# Patient Record
Sex: Female | Born: 1985 | Race: Black or African American | Hispanic: No | Marital: Married | Smoking: Never smoker
Health system: Southern US, Community
[De-identification: ages and names within clinical notes are randomized; demographics above are authoritative.]

## PROBLEM LIST (undated history)

## (undated) DIAGNOSIS — D649 Anemia, unspecified: Secondary | ICD-10-CM

## (undated) DIAGNOSIS — D219 Benign neoplasm of connective and other soft tissue, unspecified: Secondary | ICD-10-CM

## (undated) HISTORY — PX: WISDOM TOOTH EXTRACTION: SHX21

## (undated) HISTORY — PX: NO PAST SURGERIES: SHX2092

---

## 2016-09-07 NOTE — L&D Delivery Note (Addendum)
At 4:00 pm I was called for stat delivery as I was en route to hospital. Arrived in the Room At 4:07 pm post a vacuum delivery performed by Dr Gerarda Fraction. Baby being assessed by NICU personnel. Placenta undelivered and repair pending. Assumed care of patient. Dr Cletis Media called and notified of needing assistance with repair. Pt bleeding stable and holding baby.  Repair Note:  Arrived at bedside. Patient calm. Baby skin to skin. Pudendal block with 1% Lidocaine performed as well as local infiltration. Repair of 2 vaginal lacerations with 3-0 Monocryl. Repair of partial 3rd degree laceration with 2-0 Vicryl. Repair of perineum with 3-0 Monocryl. EBL 450 cc Patient tolerated procedure well

## 2016-09-07 NOTE — L&D Delivery Note (Signed)
Delivery Note At 4:04 PM a viable female was delivered via Vaginal, Vacuum (Extractor) (Presentation: LOA ).  APGAR: , ; weight pending.   Placenta status: , .  Cord:  with the following complications: .  Cord pH: 7.2  Anesthesia:  None Episiotomy: None Lacerations:   Suture Repair:  Est. Blood Loss (mL):    Called in the room as standby. Upon arrival patient was complete and pushing. Was noted that baby's heart rate was down. She pushed with good maternal effort but was not  making much progress. Had been bradycardic for about 7 minutes. Decision was made to assist with vacuum extractor. Verbal consent: obtained from patient. The soft vacuum soft cup was positioned over the sagittal suture 3 cm anterior to posterior fontanelle. Pressure was then increased to 500 mmHg, and the patient was instructed to push. Pt was assisted with pulling administered along the pelvic curve. Delivery of fetal head and spontaneous rotation of shoulders and delivery of rest of infant without difficulty. Upon delivery infant had strong cry movement of all extremities.  No nuchal cord present. Baby was taken over to warmer for NICU team to assess.   Private practice midwife arrived and resumed care prior to placenta being delivered.   Luiz Blare, DO OB Fellow

## 2017-05-25 LAB — OB RESULTS CONSOLE GC/CHLAMYDIA
Chlamydia: NEGATIVE
GC PROBE AMP, GENITAL: NEGATIVE

## 2017-05-25 LAB — OB RESULTS CONSOLE GBS: STREP GROUP B AG: POSITIVE

## 2017-06-12 ENCOUNTER — Inpatient Hospital Stay (HOSPITAL_COMMUNITY)
Admission: AD | Admit: 2017-06-12 | Discharge: 2017-06-14 | DRG: 768 | Disposition: A | Discharge status: Home / Self Care | Payer: Managed Care, Other (non HMO) | Source: Ambulatory Visit | Attending: Obstetrics and Gynecology | Admitting: Obstetrics and Gynecology

## 2017-06-12 ENCOUNTER — Encounter (HOSPITAL_COMMUNITY): Payer: Self-pay

## 2017-06-12 DIAGNOSIS — O99824 Streptococcus B carrier state complicating childbirth: Secondary | ICD-10-CM | POA: Diagnosis not present

## 2017-06-12 DIAGNOSIS — D696 Thrombocytopenia, unspecified: Secondary | ICD-10-CM | POA: Diagnosis present

## 2017-06-12 DIAGNOSIS — Z3A39 39 weeks gestation of pregnancy: Secondary | ICD-10-CM

## 2017-06-12 DIAGNOSIS — D649 Anemia, unspecified: Secondary | ICD-10-CM | POA: Diagnosis present

## 2017-06-12 DIAGNOSIS — O9912 Other diseases of the blood and blood-forming organs and certain disorders involving the immune mechanism complicating childbirth: Secondary | ICD-10-CM | POA: Diagnosis present

## 2017-06-12 DIAGNOSIS — O9902 Anemia complicating childbirth: Secondary | ICD-10-CM | POA: Diagnosis present

## 2017-06-12 DIAGNOSIS — O358XX0 Maternal care for other (suspected) fetal abnormality and damage, not applicable or unspecified: Secondary | ICD-10-CM | POA: Diagnosis present

## 2017-06-12 DIAGNOSIS — O26893 Other specified pregnancy related conditions, third trimester: Secondary | ICD-10-CM | POA: Diagnosis present

## 2017-06-12 HISTORY — DX: Anemia, unspecified: D64.9

## 2017-06-12 LAB — CBC
HCT: 41.4 % (ref 36.0–46.0)
Hemoglobin: 13.9 g/dL (ref 12.0–15.0)
MCH: 31.9 pg (ref 26.0–34.0)
MCHC: 33.6 g/dL (ref 30.0–36.0)
MCV: 95 fL (ref 78.0–100.0)
Platelets: 110 10*3/uL — ABNORMAL LOW (ref 150–400)
RBC: 4.36 MIL/uL (ref 3.87–5.11)
RDW: 14.3 % (ref 11.5–15.5)
WBC: 12.8 10*3/uL — ABNORMAL HIGH (ref 4.0–10.5)

## 2017-06-12 LAB — TYPE AND SCREEN
ABO/RH(D): O POS
Antibody Screen: NEGATIVE

## 2017-06-12 LAB — RAPID HIV SCREEN (HIV 1/2 AB+AG)
HIV 1/2 Antibodies: NONREACTIVE
HIV-1 P24 Antigen - HIV24: NONREACTIVE

## 2017-06-12 LAB — ABO/RH: ABO/RH(D): O POS

## 2017-06-12 MED ORDER — DIPHENHYDRAMINE HCL 25 MG PO CAPS: 25.0000 mg | ORAL_CAPSULE | Freq: Four times a day (QID) | ORAL | Status: DC | PRN

## 2017-06-12 MED ORDER — SIMETHICONE 80 MG PO CHEW: 80.0000 mg | CHEWABLE_TABLET | ORAL | Status: DC | PRN

## 2017-06-12 MED ORDER — COCONUT OIL OIL: 1.0000 "application " | TOPICAL_OIL | Status: DC | PRN

## 2017-06-12 MED ORDER — OXYTOCIN BOLUS FROM INFUSION
500.0000 mL | Freq: Once | INTRAVENOUS | Status: AC
Start: 2017-06-12 — End: 2017-06-12
  Administered 2017-06-12: 500 mL via INTRAVENOUS

## 2017-06-12 MED ORDER — LACTATED RINGERS IV SOLN: 500.0000 mL | Freq: Once | INTRAVENOUS | Status: DC

## 2017-06-12 MED ORDER — PHENYLEPHRINE 40 MCG/ML (10ML) SYRINGE FOR IV PUSH (FOR BLOOD PRESSURE SUPPORT)
80.0000 ug | PREFILLED_SYRINGE | INTRAVENOUS | Status: DC | PRN
  Filled 2017-06-12: qty 5

## 2017-06-12 MED ORDER — IBUPROFEN 600 MG PO TABS
600.0000 mg | ORAL_TABLET | Freq: Four times a day (QID) | ORAL | Status: DC
  Administered 2017-06-13 – 2017-06-14 (×4): 600 mg via ORAL
  Filled 2017-06-12 (×6): qty 1

## 2017-06-12 MED ORDER — SOD CITRATE-CITRIC ACID 500-334 MG/5ML PO SOLN: 30.0000 mL | ORAL | Status: DC | PRN

## 2017-06-12 MED ORDER — DIBUCAINE 1 % RE OINT: 1.0000 "application " | TOPICAL_OINTMENT | RECTAL | Status: DC | PRN

## 2017-06-12 MED ORDER — ACETAMINOPHEN 325 MG PO TABS
650.0000 mg | ORAL_TABLET | ORAL | Status: DC | PRN
Start: 2017-06-12 — End: 2017-06-14
  Administered 2017-06-13: 650 mg via ORAL

## 2017-06-12 MED ORDER — OXYCODONE-ACETAMINOPHEN 5-325 MG PO TABS: 1.0000 | ORAL_TABLET | ORAL | Status: DC | PRN

## 2017-06-12 MED ORDER — DIPHENHYDRAMINE HCL 50 MG/ML IJ SOLN: 12.5000 mg | INTRAMUSCULAR | Status: DC | PRN

## 2017-06-12 MED ORDER — BENZOCAINE-MENTHOL 20-0.5 % EX AERO
1.0000 "application " | INHALATION_SPRAY | CUTANEOUS | Status: DC | PRN
  Filled 2017-06-12 (×2): qty 56

## 2017-06-12 MED ORDER — PENICILLIN G POT IN DEXTROSE 60000 UNIT/ML IV SOLN
3.0000 10*6.[IU] | INTRAVENOUS | Status: DC
  Filled 2017-06-12 (×14): qty 50

## 2017-06-12 MED ORDER — EPHEDRINE 5 MG/ML INJ
10.0000 mg | INTRAVENOUS | Status: DC | PRN
  Filled 2017-06-12: qty 2

## 2017-06-12 MED ORDER — TETANUS-DIPHTH-ACELL PERTUSSIS 5-2.5-18.5 LF-MCG/0.5 IM SUSP: 0.5000 mL | Freq: Once | INTRAMUSCULAR | Status: DC

## 2017-06-12 MED ORDER — FENTANYL 2.5 MCG/ML BUPIVACAINE 1/10 % EPIDURAL INFUSION (WH - ANES): 14.0000 mL/h | INTRAMUSCULAR | Status: DC | PRN

## 2017-06-12 MED ORDER — PRENATAL MULTIVITAMIN CH
1.0000 | ORAL_TABLET | Freq: Every day | ORAL | Status: DC
  Administered 2017-06-13 – 2017-06-14 (×2): 1 via ORAL
  Filled 2017-06-12 (×2): qty 1

## 2017-06-12 MED ORDER — LIDOCAINE HCL (PF) 1 % IJ SOLN
30.0000 mL | INTRAMUSCULAR | Status: AC | PRN
  Administered 2017-06-12 (×2): 30 mL via SUBCUTANEOUS
  Filled 2017-06-12 (×2): qty 30

## 2017-06-12 MED ORDER — LACTATED RINGERS IV SOLN
500.0000 mL | INTRAVENOUS | Status: DC | PRN
  Administered 2017-06-12: 1000 mL via INTRAVENOUS

## 2017-06-12 MED ORDER — OXYCODONE-ACETAMINOPHEN 5-325 MG PO TABS: 2.0000 | ORAL_TABLET | ORAL | Status: DC | PRN

## 2017-06-12 MED ORDER — ONDANSETRON HCL 4 MG/2ML IJ SOLN: 4.0000 mg | INTRAMUSCULAR | Status: DC | PRN

## 2017-06-12 MED ORDER — LACTATED RINGERS IV SOLN: INTRAVENOUS | Status: DC

## 2017-06-12 MED ORDER — SENNOSIDES-DOCUSATE SODIUM 8.6-50 MG PO TABS
2.0000 | ORAL_TABLET | ORAL | Status: DC
  Administered 2017-06-12 – 2017-06-14 (×2): 2 via ORAL
  Filled 2017-06-12 (×2): qty 2

## 2017-06-12 MED ORDER — ONDANSETRON HCL 4 MG/2ML IJ SOLN: 4.0000 mg | Freq: Four times a day (QID) | INTRAMUSCULAR | Status: DC | PRN

## 2017-06-12 MED ORDER — ZOLPIDEM TARTRATE 5 MG PO TABS: 5.0000 mg | ORAL_TABLET | Freq: Every evening | ORAL | Status: DC | PRN

## 2017-06-12 MED ORDER — ACETAMINOPHEN 325 MG PO TABS
650.0000 mg | ORAL_TABLET | ORAL | Status: DC | PRN
  Filled 2017-06-12: qty 2

## 2017-06-12 MED ORDER — ONDANSETRON HCL 4 MG PO TABS: 4.0000 mg | ORAL_TABLET | ORAL | Status: DC | PRN

## 2017-06-12 MED ORDER — WITCH HAZEL-GLYCERIN EX PADS: 1.0000 "application " | MEDICATED_PAD | CUTANEOUS | Status: DC | PRN

## 2017-06-12 MED ORDER — OXYTOCIN 40 UNITS IN LACTATED RINGERS INFUSION - SIMPLE MED
2.5000 [IU]/h | INTRAVENOUS | Status: DC
  Filled 2017-06-12: qty 1000

## 2017-06-12 MED ORDER — PENICILLIN G POTASSIUM 5000000 UNITS IJ SOLR
5.0000 10*6.[IU] | Freq: Once | INTRAVENOUS | Status: AC
  Administered 2017-06-12: 5 10*6.[IU] via INTRAVENOUS
  Filled 2017-06-12: qty 5

## 2017-06-12 NOTE — H&P (Signed)
Wanda Coffey is a 31 y.o. female presenting for Spontaneous labor; pregnancy remarkable for absent nasal bone in fetus; anemia, low platelets. OB History    Gravida Para Term Preterm AB Living   1 1 1     1    SAB TAB Ectopic Multiple Live Births         0 1     Past Medical History:  Diagnosis Date  . Anemia    Past Surgical History:  Procedure Laterality Date  . WISDOM TOOTH EXTRACTION     Family History: family history includes Cancer in her father; Heart disease in her father. Social History:  reports that she has never smoked. She has never used smokeless tobacco. She reports that she does not drink alcohol or use drugs.     Maternal Diabetes: No Genetic Screening: Declined Maternal Ultrasounds/Referrals: Declined Fetal Ultrasounds or other Referrals:  Declined Maternal Substance Abuse:  No Significant Maternal Medications:  None Significant Maternal Lab Results:  Lab values include: Group B Strep positive Other Comments:  None  Review of Systems  Gastrointestinal: Positive for abdominal pain.  All other systems reviewed and are negative.  Maternal Medical History:  Reason for admission: Contractions.   Contractions: Onset was 6-12 hours ago.   Frequency: regular.   Perceived severity is strong.    Fetal activity: Perceived fetal activity is normal.   Last perceived fetal movement was within the past hour.    Prenatal complications: Thrombocytopenia.     Dilation: 10 Effacement (%): 100 Station: +2, +3 Exam by:: A. Schwarz RN  Blood pressure 108/70, pulse 81, temperature 98.2 F (36.8 C), temperature source Oral, resp. rate 18, height 5\' 1"  (1.549 m), weight 170 lb (77.1 kg), last menstrual period 09/10/2016, unknown if currently breastfeeding. Maternal Exam:  Uterine Assessment: Contraction strength is moderate.  Contraction frequency is regular.   Abdomen: Patient reports no abdominal tenderness. Estimated fetal weight is 7 pounds.   Fetal presentation:  vertex  Introitus: Normal vulva. Normal vagina.  Amniotic fluid character: meconium stained.  Pelvis: adequate for delivery.   Cervix: Cervix evaluated by digital exam.     Fetal Exam Fetal Monitor Review: Mode: ultrasound.   Variability: moderate (6-25 bpm).   Pattern: accelerations present.    Fetal State Assessment: Category I - tracings are normal.     Physical Exam  Nursing note and vitals reviewed. Constitutional: She is oriented to person, place, and time. She appears well-developed and well-nourished.  HENT:  Head: Normocephalic and atraumatic.  Neck: Normal range of motion.  Cardiovascular: Normal rate and regular rhythm.   Respiratory: Effort normal and breath sounds normal. No respiratory distress.  GI: Soft.  Genitourinary: Vagina normal.  Musculoskeletal: Normal range of motion.  Neurological: She is alert and oriented to person, place, and time.  Skin: Skin is warm and dry.  Psychiatric: She has a normal mood and affect. Her behavior is normal. Judgment and thought content normal.    Prenatal labs: ABO, Rh: --/--/O POS (10/06 1418) Antibody: NEG (10/06 1418) Rubella: Rubella pending RPR:   pending HBsAg:   pending HIV:   pending GBS: Positive (09/18 0000)   Assessment/Plan: Admit IUP @ 39+5 GBS Positive ABX Prophylaxis Expectant management  Lori A Clemmons CNM 06/12/2017, 5:54 PM

## 2017-06-12 NOTE — MAU Note (Signed)
Ctx since 0300 that have been getting stronger and closer together. No bleeding, no LOF. +fetal movement

## 2017-06-13 LAB — CBC
HCT: 34.8 % — ABNORMAL LOW (ref 36.0–46.0)
Hemoglobin: 11.8 g/dL — ABNORMAL LOW (ref 12.0–15.0)
MCH: 31.9 pg (ref 26.0–34.0)
MCHC: 33.9 g/dL (ref 30.0–36.0)
MCV: 94.1 fL (ref 78.0–100.0)
Platelets: 127 10*3/uL — ABNORMAL LOW (ref 150–400)
RBC: 3.7 MIL/uL — ABNORMAL LOW (ref 3.87–5.11)
RDW: 14.2 % (ref 11.5–15.5)
WBC: 22.2 10*3/uL — ABNORMAL HIGH (ref 4.0–10.5)

## 2017-06-13 LAB — RPR: RPR Ser Ql: NONREACTIVE

## 2017-06-13 LAB — HEPATITIS B SURFACE ANTIGEN: Hepatitis B Surface Ag: NEGATIVE

## 2017-06-13 LAB — HIV ANTIBODY (ROUTINE TESTING W REFLEX): HIV Screen 4th Generation wRfx: NONREACTIVE

## 2017-06-13 NOTE — Progress Notes (Signed)
Post Partum Day 1 Subjective: no complaints  Objective: Blood pressure 113/60, pulse 78, temperature 99 F (37.2 C), temperature source Oral, resp. rate 18, height 5\' 1"  (1.549 m), weight 170 lb (77.1 kg), last menstrual period 09/10/2016, unknown if currently breastfeeding.  Physical Exam:  General: alert, cooperative and appears stated age Lochia: appropriate Uterine Fundus: firm Incision:  DVT Evaluation: No evidence of DVT seen on physical exam.   Recent Labs  06/12/17 1418 06/13/17 0533  HGB 13.9 11.8*  HCT 41.4 34.8*    Assessment/Plan: Plan for discharge tomorrow and Breastfeeding   LOS: 1 day   Lori A Clemmons CNM 06/13/2017, 12:24 PM

## 2017-06-14 LAB — RUBELLA SCREEN: Rubella: 11.1 index (ref >0.99)

## 2017-06-14 MED ORDER — IBUPROFEN 600 MG PO TABS: 600.0000 mg | ORAL_TABLET | Freq: Four times a day (QID) | ORAL | 1 refills | Status: DC | PRN

## 2017-06-14 NOTE — Discharge Summary (Signed)
OB Discharge Summary     Patient Name: Wanda Coffey DOB: 04/17/1986 MRN: [id number]  Date of admission: 06/12/2017 Delivering MD: Katheren Shams   Date of discharge: 06/14/2017  Admitting diagnosis: 39.2 WKS, CTXS Intrauterine pregnancy: [redacted]w[redacted]d     Secondary diagnosis:  Active Problems:   Indication for care in labor or delivery   SVD (spontaneous vaginal delivery)  Additional problems: None     Discharge diagnosis: Term Pregnancy Delivered                                                                                                Post partum procedures:None  Augmentation: None  Complications: None  Hospital course:  Onset of Labor With Vaginal Delivery     31 y.o. yo G1P1001 at [redacted]w[redacted]d was admitted in Active Labor in 2nd Stage on 06/12/2017. Patient had an uncomplicated labor course as follows:  Membrane Rupture Time/Date: 3:49 PM ,06/12/2017   Intrapartum Procedures: Episiotomy: None [1]                                         Lacerations:  3rd degree [4]  Patient had a delivery of a Viable infant. 06/12/2017  Information for the patient's newborn:  Bari, Leib [[id number]]       Pateint had an uncomplicated postpartum course.  She is ambulating, tolerating a regular diet, passing flatus, and urinating well. Patient is discharged home in stable condition on 06/14/17.   Physical exam  Vitals:   06/12/17 2330 06/13/17 0525 06/13/17 1900 06/14/17 0517  BP: (!) 105/59 113/60 104/64 (!) 94/58  Pulse: 95 78 85 73  Resp: 18 18 20 20   Temp: 99.4 F (37.4 C) 99 F (37.2 C) 98.9 F (37.2 C) 97.9 F (36.6 C)  TempSrc: Oral Oral Oral Oral  Weight:      Height:       General: alert, cooperative and no distress Lochia: appropriate Uterine Fundus: firm Incision: N/A DVT Evaluation: No significant calf/ankle edema. Labs: Lab Results  Component Value Date   WBC 22.2 (H) 06/13/2017   HGB 11.8 (L) 06/13/2017   HCT 34.8 (L) 06/13/2017   MCV 94.1 06/13/2017   PLT  127 (L) 06/13/2017   No flowsheet data found.  Discharge instruction: per After Visit Summary and "Baby and Me Booklet". Pain Management, Peri-Care, Breastfeeding, Who and When to call for postpartum complications. Information Sheet(s) given PPD & BB, Contraception Choices.   After visit meds:  Allergies as of 06/14/2017   No Known Allergies     Medication List    STOP taking these medications   PROBIOTIC PO     TAKE these medications   ferrous sulfate 325 (65 FE) MG tablet Take 325 mg by mouth 2 (two) times daily.   ibuprofen 600 MG tablet Commonly known as:  ADVIL,MOTRIN Take 1 tablet (600 mg total) by mouth every 6 (six) hours as needed.   prenatal multivitamin Tabs tablet Take 1 tablet by mouth daily at 12 noon.  Diet: routine diet  Activity: Advance as tolerated. Pelvic rest for 6 weeks.   Outpatient follow up:6 weeks Follow up Appt:No future appointments. Follow up Visit:No Follow-up on file.  Postpartum contraception: Undecided  Newborn Data: Live born female-[place] Birth Weight: 8 lb 1.3 oz (3665 g) APGAR: 7, 9  Newborn Delivery   Birth date/time:  06/12/2017 16:04:00 Delivery type:  Vaginal, Vacuum (Extractor)      Baby Feeding: Breast Disposition:home with mother   06/14/2017 Maryann Conners, CNM

## 2017-06-14 NOTE — Lactation Note (Addendum)
This note was copied from a baby's chart. Lactation Consultation Note New mom states BF going well. Mom has been BF for 10 min. Discussed feeding durations, cluster feeding, positions, stimulating to keep baby feeding, snacking verses nutritive feeding. Educated on newborn behavior, STS, I&O, supply and demand. Mom encouraged to feed baby 8-12 times/24 hours and with feeding cues.  Discussed engorgement, filling, mature milk, colostrum, management, breast massage, and breast support. Hand pump given, demonstrated. Mom has large breast, filling, hand expression taught. Easy flow of colostrum noted. Mom has short shaft everted nipples. Shells given to wear, encouraged to wear supportive bra.  Baby has had 8 stools, 4 voids since birth. Discussed BF at home after d/c home.  [place] brochure given w/resources, support groups and [place] services.    Patient Name: Wanda Coffey LTRVU'Y Date: 06/14/2017 Reason for consult: Initial assessment   Maternal Data Has patient been taught Hand Expression?: Yes Does the patient have breastfeeding experience prior to this delivery?: No  Feeding Feeding Type: Breast Fed Length of feed: 25 min  LATCH Score Latch: Repeated attempts needed to sustain latch, nipple held in mouth throughout feeding, stimulation needed to elicit sucking reflex.  Audible Swallowing: Spontaneous and intermittent  Type of Nipple: Everted at rest and after stimulation  Comfort (Breast/Nipple): Soft / non-tender  Hold (Positioning): Assistance needed to correctly position infant at breast and maintain latch.  LATCH Score: 8  Interventions Interventions: Breast feeding basics reviewed;Breast compression;Assisted with latch;Adjust position;Hand pump;Support pillows;Breast massage;Position options;Hand express;Shells  Lactation Tools Discussed/Used Tools: Pump;Shells Shell Type: Inverted Breast pump type: Manual   Consult Status Consult Status: Complete Date:  06/14/17    Theodoro Kalata 06/14/2017, 4:00 AM

## 2017-06-14 NOTE — Discharge Instructions (Signed)
Postpartum Depression and Baby Blues The postpartum period begins right after the birth of a baby. During this time, there is often a great amount of joy and excitement. It is also a time of many changes in the life of the parents. Regardless of how many times a mother gives birth, each child brings new challenges and dynamics to the family. It is not unusual to have feelings of excitement along with confusing shifts in moods, emotions, and thoughts. All mothers are at risk of developing postpartum depression or the "baby blues." These mood changes can occur right after giving birth, or they may occur many months after giving birth. The baby blues or postpartum depression can be mild or severe. Additionally, postpartum depression can go away rather quickly, or it can be a long-term condition. What are the causes? Raised hormone levels and the rapid drop in those levels are thought to be a main cause of postpartum depression and the baby blues. A number of hormones change during and after pregnancy. Estrogen and progesterone usually decrease right after the delivery of your baby. The levels of thyroid hormone and various cortisol steroids also rapidly drop. Other factors that play a role in these mood changes include major life events and genetics. What increases the risk? If you have any of the following risks for the baby blues or postpartum depression, know what symptoms to watch out for during the postpartum period. Risk factors that may increase the likelihood of getting the baby blues or postpartum depression include:  Having a personal or family history of depression.  Having depression while being pregnant.  Having premenstrual mood issues or mood issues related to oral contraceptives.  Having a lot of life stress.  Having marital conflict.  Lacking a social support network.  Having a baby with special needs.  Having health problems, such as diabetes.  What are the signs or  symptoms? Symptoms of baby blues include:  Brief changes in mood, such as going from extreme happiness to sadness.  Decreased concentration.  Difficulty sleeping.  Crying spells, tearfulness.  Irritability.  Anxiety.  Symptoms of postpartum depression typically begin within the first month after giving birth. These symptoms include:  Difficulty sleeping or excessive sleepiness.  Marked weight loss.  Agitation.  Feelings of worthlessness.  Lack of interest in activity or food.  Postpartum psychosis is a very serious condition and can be dangerous. Fortunately, it is rare. Displaying any of the following symptoms is cause for immediate medical attention. Symptoms of postpartum psychosis include:  Hallucinations and delusions.  Bizarre or disorganized behavior.  Confusion or disorientation.  How is this diagnosed? A diagnosis is made by an evaluation of your symptoms. There are no medical or lab tests that lead to a diagnosis, but there are various questionnaires that a health care provider may use to identify those with the baby blues, postpartum depression, or psychosis. Often, a screening tool called the [place] Postnatal Depression Scale is used to diagnose depression in the postpartum period. How is this treated? The baby blues usually goes away on its own in 1-2 weeks. Social support is often all that is needed. You will be encouraged to get adequate sleep and rest. Occasionally, you may be given medicines to help you sleep. Postpartum depression requires treatment because it can last several months or longer if it is not treated. Treatment may include individual or group therapy, medicine, or both to address any social, physiological, and psychological factors that may play a role in the  depression. Regular exercise, a healthy diet, rest, and social support may also be strongly recommended. Postpartum psychosis is more serious and needs treatment right away.  Hospitalization is often needed. Follow these instructions at home:  Get as much rest as you can. Nap when the baby sleeps.  Exercise regularly. Some women find yoga and walking to be beneficial.  Eat a balanced and nourishing diet.  Do little things that you enjoy. Have a cup of tea, take a bubble bath, read your favorite magazine, or listen to your favorite music.  Avoid alcohol.  Ask for help with household chores, cooking, grocery shopping, or running errands as needed. Do not try to do everything.  Talk to people close to you about how you are feeling. Get support from your partner, family members, friends, or other new moms.  Try to stay positive in how you think. Think about the things you are grateful for.  Do not spend a lot of time alone.  Only take over-the-counter or prescription medicine as directed by your health care provider.  Keep all your postpartum appointments.  Let your health care provider know if you have any concerns. Contact a health care provider if: You are having a reaction to or problems with your medicine. Get help right away if:  You have suicidal feelings.  You think you may harm the baby or someone else. This information is not intended to replace advice given to you by your health care provider. Make sure you discuss any questions you have with your health care provider. Document Released: 05/28/2004 Document Revised: 01/30/2016 Document Reviewed: 06/05/2013 Elsevier Interactive Patient Education  2017 [employer]. Contraception Choices Contraception (birth control) is the use of any methods or devices to prevent pregnancy. Below are some methods to help avoid pregnancy. Hormonal methods  Contraceptive implant. This is a thin, plastic tube containing progesterone hormone. It does not contain estrogen hormone. Your health care provider inserts the tube in the inner part of the upper arm. The tube can remain in place for up to 3 years. After 3  years, the implant must be removed. The implant prevents the ovaries from releasing an egg (ovulation), thickens the cervical mucus to prevent sperm from entering the uterus, and thins the lining of the inside of the uterus.  Progesterone-only injections. These injections are given every 3 months by your health care provider to prevent pregnancy. This synthetic progesterone hormone stops the ovaries from releasing eggs. It also thickens cervical mucus and changes the uterine lining. This makes it harder for sperm to survive in the uterus.  Birth control pills. These pills contain estrogen and progesterone hormone. They work by preventing the ovaries from releasing eggs (ovulation). They also cause the cervical mucus to thicken, preventing the sperm from entering the uterus. Birth control pills are prescribed by a health care provider.Birth control pills can also be used to treat heavy periods.  Minipill. This type of birth control pill contains only the progesterone hormone. They are taken every day of each month and must be prescribed by your health care provider.  Birth control patch. The patch contains hormones similar to those in birth control pills. It must be changed once a week and is prescribed by a health care provider.  Vaginal ring. The ring contains hormones similar to those in birth control pills. It is left in the vagina for 3 weeks, removed for 1 week, and then a new one is put back in place. The patient must be comfortable  inserting and removing the ring from the vagina.A health care provider's prescription is necessary.  Emergency contraception. Emergency contraceptives prevent pregnancy after unprotected sexual intercourse. This pill can be taken right after sex or up to 5 days after unprotected sex. It is most effective the sooner you take the pills after having sexual intercourse. Most emergency contraceptive pills are available without a prescription. Check with your pharmacist. Do  not use emergency contraception as your only form of birth control. Barrier methods  Female condom. This is a thin sheath (latex or rubber) that is worn over the penis during sexual intercourse. It can be used with spermicide to increase effectiveness.  Female condom. This is a soft, loose-fitting sheath that is put into the vagina before sexual intercourse.  Diaphragm. This is a soft, latex, dome-shaped barrier that must be fitted by a health care provider. It is inserted into the vagina, along with a spermicidal jelly. It is inserted before intercourse. The diaphragm should be left in the vagina for 6 to 8 hours after intercourse.  Cervical cap. This is a round, soft, latex or plastic cup that fits over the cervix and must be fitted by a health care provider. The cap can be left in place for up to 48 hours after intercourse.  Sponge. This is a soft, circular piece of polyurethane foam. The sponge has spermicide in it. It is inserted into the vagina after wetting it and before sexual intercourse.  Spermicides. These are chemicals that kill or block sperm from entering the cervix and uterus. They come in the form of creams, jellies, suppositories, foam, or tablets. They do not require a prescription. They are inserted into the vagina with an applicator before having sexual intercourse. The process must be repeated every time you have sexual intercourse. Intrauterine contraception  Intrauterine device (IUD). This is a T-shaped device that is put in a woman's uterus during a menstrual period to prevent pregnancy. There are 2 types: ? Copper IUD. This type of IUD is wrapped in copper wire and is placed inside the uterus. Copper makes the uterus and fallopian tubes produce a fluid that kills sperm. It can stay in place for 10 years. ? Hormone IUD. This type of IUD contains the hormone progestin (synthetic progesterone). The hormone thickens the cervical mucus and prevents sperm from entering the uterus,  and it also thins the uterine lining to prevent implantation of a fertilized egg. The hormone can weaken or kill the sperm that get into the uterus. It can stay in place for 3-5 years, depending on which type of IUD is used. Permanent methods of contraception  Female tubal ligation. This is when the woman's fallopian tubes are surgically sealed, tied, or blocked to prevent the egg from traveling to the uterus.  Hysteroscopic sterilization. This involves placing a small coil or insert into each fallopian tube. Your doctor uses a technique called hysteroscopy to do the procedure. The device causes scar tissue to form. This results in permanent blockage of the fallopian tubes, so the sperm cannot fertilize the egg. It takes about 3 months after the procedure for the tubes to become blocked. You must use another form of birth control for these 3 months.  Female sterilization. This is when the female has the tubes that carry sperm tied off (vasectomy).This blocks sperm from entering the vagina during sexual intercourse. After the procedure, the man can still ejaculate fluid (semen). Natural planning methods  Natural family planning. This is not having sexual intercourse or  using a barrier method (condom, diaphragm, cervical cap) on days the woman could become pregnant.  Calendar method. This is keeping track of the length of each menstrual cycle and identifying when you are fertile.  Ovulation method. This is avoiding sexual intercourse during ovulation.  Symptothermal method. This is avoiding sexual intercourse during ovulation, using a thermometer and ovulation symptoms.  Post-ovulation method. This is timing sexual intercourse after you have ovulated. Regardless of which type or method of contraception you choose, it is important that you use condoms to protect against the transmission of sexually transmitted infections (STIs). Talk with your health care provider about which form of contraception is  most appropriate for you. This information is not intended to replace advice given to you by your health care provider. Make sure you discuss any questions you have with your health care provider. Document Released: 08/24/2005 Document Revised: 01/30/2016 Document Reviewed: 02/16/2013 Elsevier Interactive Patient Education  2017 [employer].

## 2018-05-03 ENCOUNTER — Other Ambulatory Visit (HOSPITAL_COMMUNITY): Payer: Self-pay | Admitting: Obstetrics and Gynecology

## 2018-05-03 DIAGNOSIS — O30011 Twin pregnancy, monochorionic/monoamniotic, first trimester: Secondary | ICD-10-CM

## 2018-05-18 ENCOUNTER — Encounter (HOSPITAL_COMMUNITY): Payer: Self-pay

## 2018-05-18 ENCOUNTER — Telehealth (HOSPITAL_COMMUNITY): Payer: Self-pay | Admitting: *Deleted

## 2018-05-20 ENCOUNTER — Ambulatory Visit (HOSPITAL_COMMUNITY)
Admission: RE | Admit: 2018-05-20 | Discharge: 2018-05-20 | Disposition: A | Discharge status: Home / Self Care | Payer: 59 | Source: Ambulatory Visit | Attending: Obstetrics and Gynecology | Admitting: Obstetrics and Gynecology

## 2018-05-20 ENCOUNTER — Other Ambulatory Visit (HOSPITAL_COMMUNITY): Payer: Self-pay | Admitting: *Deleted

## 2018-05-20 ENCOUNTER — Encounter (HOSPITAL_COMMUNITY): Payer: Self-pay

## 2018-05-20 ENCOUNTER — Ambulatory Visit (HOSPITAL_BASED_OUTPATIENT_CLINIC_OR_DEPARTMENT_OTHER)
Admission: RE | Admit: 2018-05-20 | Discharge: 2018-05-20 | Disposition: A | Discharge status: Home / Self Care | Payer: 59 | Source: Ambulatory Visit | Attending: Obstetrics and Gynecology | Admitting: Obstetrics and Gynecology

## 2018-05-20 DIAGNOSIS — D649 Anemia, unspecified: Secondary | ICD-10-CM | POA: Insufficient documentation

## 2018-05-20 DIAGNOSIS — O30032 Twin pregnancy, monochorionic/diamniotic, second trimester: Secondary | ICD-10-CM | POA: Diagnosis not present

## 2018-05-20 DIAGNOSIS — Z3A16 16 weeks gestation of pregnancy: Secondary | ICD-10-CM | POA: Diagnosis not present

## 2018-05-20 DIAGNOSIS — O99012 Anemia complicating pregnancy, second trimester: Secondary | ICD-10-CM

## 2018-05-20 DIAGNOSIS — O30011 Twin pregnancy, monochorionic/monoamniotic, first trimester: Secondary | ICD-10-CM | POA: Diagnosis present

## 2018-05-20 NOTE — Consult Note (Signed)
Maternal-Fetal Medicine  Patient's Name: Wanda Coffey MRN: [id number] Requesting Provider: Carlynn Purl, MD  I had the pleasure of seeing Ms. Dedic today at the [facility]. She was accompanied by her husband. She is a G2 P1 at 27-weeks' gestation with twin pregnancy and is here for ultrasound and consultation. On your office ultrasound, monochorionic-monoamniotic twin pregnancy was suspected.  This is a natural conception. Obstetric history is significant for a term vaginal delivery of a female infant weighing 8-1 at birth.  Gyn history: Patient reports she had regular periods.  PMH: No history of diabetes or hypertension or any other chronic medical conditions. Patient reports she does not have sickle-cell trait. PSH: Nil of note. Medications: Prenatal vitamins. Allergies: NKDA. Social: Denies tobacco or drug or alcohol use. Her partner is an Sales promotion account executive and he is in good health. Family: No history of venous thromboembolism.  A limited ultrasound study was performed. We confirmed monochorionic-diamniotic twin pregnancy. Dividing membrane is clearly seen.  Twin A: Lower fetus, transverse lie, anterior placenta. Amniotic fluid is normal and good fetal activity is seen. Fetal bladder is seen.  Twin B: Upper fetus, transverse, anterior placenta. Amniotic fluid is normal and good fetal activity is seen. Fetal bladder is seen.  No evidence of twin-to-twin transfusion syndrome.  I counseled the couple on the following: Monochorionic-diamniotic twin pregnancy: -Explained chorionicity and its implications. -Monochorionic twins have a higher rate of complications including miscarriages, congenital malformations, twin to twin transfusion syndrome(TTTS) (15%), selective growth restriction, and fetal demise of one or both twins. -Twin pregnancies are associated with increased likelihood of gestational diabetes, gestational hypertension or preeclampsia, malpresentations,  cesarean delivery and postpartum hemorrhage. -Preterm delivery is the most-common complication of twin pregnancies.  -I discussed ultrasound surveillance for TTTS every 2 weeks from 16 weeks until delivery. -I discussed timing and mode of delivery. We recommend delivery at 37 weeks in monochorionic twins to prevent the likelihood of stillbirth of one or both twins that is increased in monochorionic twins. Earlier delivery may be indicated if this pregnancy is complicated by fetal growth restriction or other maternal complications. -In vertex/vertex presentations, vaginal delivery can be safely undertaken. In non-vertex presentation of twin A, we recommend cesarean delivery. If first twin is vertex, and the second twin is non-vertex, either cesarean delivery or vaginal delivery of first twin followed by internal podalic version of second twin may be performed (depends on obstetrician's experience and patient's preference).  Low-dose aspirin is beneficial in preventing preeclampsia. Based on systematic reviews, the [place] Preventive Services Task Force (USPSTF) recommended low-dose aspirin in pregnancies that are at high-risk of developing preeclampsia ([place] [phone]). I recommended low-dose aspirin starting today till delivery.  Recommendations: -Fetal anatomy scan in 2 weeks. -TTTS surveillance ultrasound every 2 weeks. -Fetal growth assessment every 4 weeks. -Weekly antenatal testing from 32 weeks. -Recommend delivery at 37 weeks. -Aspirin 81 mg daily till delivery.  Thank you for your consult. Please do not hesitate to contact me if you have any questions or concerns.  Consultation including face-to-face counseling: 40 min.

## 2018-06-06 ENCOUNTER — Ambulatory Visit (HOSPITAL_COMMUNITY)
Admission: RE | Admit: 2018-06-06 | Discharge: 2018-06-06 | Disposition: A | Discharge status: Home / Self Care | Payer: 59 | Source: Ambulatory Visit | Attending: Obstetrics and Gynecology | Admitting: Obstetrics and Gynecology

## 2018-06-06 ENCOUNTER — Encounter (HOSPITAL_COMMUNITY): Payer: Self-pay

## 2018-06-06 DIAGNOSIS — Z363 Encounter for antenatal screening for malformations: Secondary | ICD-10-CM | POA: Insufficient documentation

## 2018-06-06 DIAGNOSIS — Z3A18 18 weeks gestation of pregnancy: Secondary | ICD-10-CM

## 2018-06-06 DIAGNOSIS — O3412 Maternal care for benign tumor of corpus uteri, second trimester: Secondary | ICD-10-CM | POA: Diagnosis not present

## 2018-06-06 DIAGNOSIS — D259 Leiomyoma of uterus, unspecified: Secondary | ICD-10-CM | POA: Diagnosis not present

## 2018-06-06 DIAGNOSIS — O30011 Twin pregnancy, monochorionic/monoamniotic, first trimester: Secondary | ICD-10-CM

## 2018-06-06 DIAGNOSIS — O30032 Twin pregnancy, monochorionic/diamniotic, second trimester: Secondary | ICD-10-CM | POA: Diagnosis not present

## 2018-06-17 ENCOUNTER — Other Ambulatory Visit (HOSPITAL_COMMUNITY): Payer: Self-pay | Admitting: *Deleted

## 2018-06-17 ENCOUNTER — Other Ambulatory Visit (HOSPITAL_COMMUNITY): Payer: Self-pay | Admitting: Obstetrics and Gynecology

## 2018-06-17 ENCOUNTER — Ambulatory Visit (HOSPITAL_COMMUNITY)
Admission: RE | Admit: 2018-06-17 | Discharge: 2018-06-17 | Disposition: A | Discharge status: Home / Self Care | Payer: 59 | Source: Ambulatory Visit | Attending: Obstetrics and Gynecology | Admitting: Obstetrics and Gynecology

## 2018-06-17 DIAGNOSIS — O30039 Twin pregnancy, monochorionic/diamniotic, unspecified trimester: Secondary | ICD-10-CM

## 2018-06-17 DIAGNOSIS — O30032 Twin pregnancy, monochorionic/diamniotic, second trimester: Secondary | ICD-10-CM

## 2018-06-17 DIAGNOSIS — O3412 Maternal care for benign tumor of corpus uteri, second trimester: Secondary | ICD-10-CM | POA: Diagnosis not present

## 2018-06-17 DIAGNOSIS — D259 Leiomyoma of uterus, unspecified: Secondary | ICD-10-CM | POA: Diagnosis not present

## 2018-06-17 DIAGNOSIS — Z3A20 20 weeks gestation of pregnancy: Secondary | ICD-10-CM | POA: Insufficient documentation

## 2018-06-20 ENCOUNTER — Other Ambulatory Visit (HOSPITAL_COMMUNITY): Payer: Managed Care, Other (non HMO)

## 2018-07-01 ENCOUNTER — Ambulatory Visit (HOSPITAL_COMMUNITY)
Admission: RE | Admit: 2018-07-01 | Discharge: 2018-07-01 | Disposition: A | Discharge status: Home / Self Care | Payer: 59 | Source: Ambulatory Visit | Attending: Obstetrics and Gynecology | Admitting: Obstetrics and Gynecology

## 2018-07-01 ENCOUNTER — Encounter (HOSPITAL_COMMUNITY): Payer: Self-pay

## 2018-07-01 DIAGNOSIS — Z362 Encounter for other antenatal screening follow-up: Secondary | ICD-10-CM | POA: Diagnosis not present

## 2018-07-01 DIAGNOSIS — O30032 Twin pregnancy, monochorionic/diamniotic, second trimester: Secondary | ICD-10-CM | POA: Diagnosis present

## 2018-07-01 DIAGNOSIS — O99012 Anemia complicating pregnancy, second trimester: Secondary | ICD-10-CM | POA: Diagnosis not present

## 2018-07-01 DIAGNOSIS — D259 Leiomyoma of uterus, unspecified: Secondary | ICD-10-CM | POA: Insufficient documentation

## 2018-07-01 DIAGNOSIS — Z3A22 22 weeks gestation of pregnancy: Secondary | ICD-10-CM | POA: Diagnosis not present

## 2018-07-01 DIAGNOSIS — O3412 Maternal care for benign tumor of corpus uteri, second trimester: Secondary | ICD-10-CM | POA: Diagnosis not present

## 2018-07-04 ENCOUNTER — Ambulatory Visit (HOSPITAL_COMMUNITY): Payer: Managed Care, Other (non HMO)

## 2018-07-15 ENCOUNTER — Other Ambulatory Visit (HOSPITAL_COMMUNITY): Payer: Self-pay | Admitting: *Deleted

## 2018-07-15 ENCOUNTER — Encounter (HOSPITAL_COMMUNITY): Payer: Self-pay

## 2018-07-15 ENCOUNTER — Ambulatory Visit (HOSPITAL_COMMUNITY)
Admission: RE | Admit: 2018-07-15 | Discharge: 2018-07-15 | Disposition: A | Discharge status: Home / Self Care | Payer: 59 | Source: Ambulatory Visit | Attending: Obstetrics and Gynecology | Admitting: Obstetrics and Gynecology

## 2018-07-15 DIAGNOSIS — O30032 Twin pregnancy, monochorionic/diamniotic, second trimester: Secondary | ICD-10-CM | POA: Insufficient documentation

## 2018-07-15 DIAGNOSIS — O3412 Maternal care for benign tumor of corpus uteri, second trimester: Secondary | ICD-10-CM

## 2018-07-15 DIAGNOSIS — D259 Leiomyoma of uterus, unspecified: Secondary | ICD-10-CM

## 2018-07-15 DIAGNOSIS — O30039 Twin pregnancy, monochorionic/diamniotic, unspecified trimester: Secondary | ICD-10-CM

## 2018-07-15 DIAGNOSIS — Z362 Encounter for other antenatal screening follow-up: Secondary | ICD-10-CM

## 2018-07-15 DIAGNOSIS — Z3A24 24 weeks gestation of pregnancy: Secondary | ICD-10-CM

## 2018-07-15 DIAGNOSIS — O99012 Anemia complicating pregnancy, second trimester: Secondary | ICD-10-CM

## 2018-07-29 ENCOUNTER — Other Ambulatory Visit (HOSPITAL_COMMUNITY): Payer: Self-pay | Admitting: Maternal and Fetal Medicine

## 2018-07-29 ENCOUNTER — Ambulatory Visit (HOSPITAL_COMMUNITY)
Admission: RE | Admit: 2018-07-29 | Discharge: 2018-07-29 | Disposition: A | Discharge status: Home / Self Care | Payer: 59 | Source: Ambulatory Visit | Attending: Maternal & Fetal Medicine | Admitting: Maternal & Fetal Medicine

## 2018-07-29 ENCOUNTER — Other Ambulatory Visit (HOSPITAL_COMMUNITY): Payer: Self-pay | Admitting: *Deleted

## 2018-07-29 ENCOUNTER — Encounter (HOSPITAL_COMMUNITY): Payer: Self-pay

## 2018-07-29 DIAGNOSIS — O3412 Maternal care for benign tumor of corpus uteri, second trimester: Secondary | ICD-10-CM | POA: Diagnosis not present

## 2018-07-29 DIAGNOSIS — Z3A26 26 weeks gestation of pregnancy: Secondary | ICD-10-CM | POA: Diagnosis not present

## 2018-07-29 DIAGNOSIS — O30032 Twin pregnancy, monochorionic/diamniotic, second trimester: Secondary | ICD-10-CM | POA: Diagnosis not present

## 2018-07-29 DIAGNOSIS — O99012 Anemia complicating pregnancy, second trimester: Secondary | ICD-10-CM | POA: Diagnosis not present

## 2018-07-29 DIAGNOSIS — O30039 Twin pregnancy, monochorionic/diamniotic, unspecified trimester: Secondary | ICD-10-CM

## 2018-08-09 LAB — OB RESULTS CONSOLE ABO/RH: RH Type: POSITIVE

## 2018-08-09 LAB — OB RESULTS CONSOLE HEPATITIS B SURFACE ANTIGEN: Hepatitis B Surface Ag: NEGATIVE

## 2018-08-09 LAB — OB RESULTS CONSOLE ANTIBODY SCREEN: Antibody Screen: NEGATIVE

## 2018-08-09 LAB — OB RESULTS CONSOLE RUBELLA ANTIBODY, IGM: Rubella: IMMUNE

## 2018-08-09 LAB — OB RESULTS CONSOLE HIV ANTIBODY (ROUTINE TESTING): HIV: NONREACTIVE

## 2018-08-09 LAB — OB RESULTS CONSOLE GC/CHLAMYDIA
CHLAMYDIA, DNA PROBE: NEGATIVE
Gonorrhea: NEGATIVE

## 2018-08-09 LAB — OB RESULTS CONSOLE RPR: RPR: NONREACTIVE

## 2018-08-12 ENCOUNTER — Ambulatory Visit (HOSPITAL_COMMUNITY)
Admission: RE | Admit: 2018-08-12 | Discharge: 2018-08-12 | Disposition: A | Discharge status: Home / Self Care | Payer: 59 | Source: Ambulatory Visit | Attending: Obstetrics and Gynecology | Admitting: Obstetrics and Gynecology

## 2018-08-12 ENCOUNTER — Other Ambulatory Visit (HOSPITAL_COMMUNITY): Payer: Self-pay | Admitting: *Deleted

## 2018-08-12 ENCOUNTER — Encounter (HOSPITAL_COMMUNITY): Payer: Self-pay

## 2018-08-12 DIAGNOSIS — O321XX1 Maternal care for breech presentation, fetus 1: Secondary | ICD-10-CM | POA: Insufficient documentation

## 2018-08-12 DIAGNOSIS — O3413 Maternal care for benign tumor of corpus uteri, third trimester: Secondary | ICD-10-CM | POA: Diagnosis not present

## 2018-08-12 DIAGNOSIS — O30039 Twin pregnancy, monochorionic/diamniotic, unspecified trimester: Secondary | ICD-10-CM

## 2018-08-12 DIAGNOSIS — D259 Leiomyoma of uterus, unspecified: Secondary | ICD-10-CM | POA: Insufficient documentation

## 2018-08-12 DIAGNOSIS — O99013 Anemia complicating pregnancy, third trimester: Secondary | ICD-10-CM | POA: Insufficient documentation

## 2018-08-12 DIAGNOSIS — D649 Anemia, unspecified: Secondary | ICD-10-CM | POA: Insufficient documentation

## 2018-08-12 DIAGNOSIS — O30033 Twin pregnancy, monochorionic/diamniotic, third trimester: Secondary | ICD-10-CM | POA: Diagnosis not present

## 2018-08-12 DIAGNOSIS — O3412 Maternal care for benign tumor of corpus uteri, second trimester: Secondary | ICD-10-CM

## 2018-08-12 DIAGNOSIS — Z3A28 28 weeks gestation of pregnancy: Secondary | ICD-10-CM | POA: Diagnosis not present

## 2018-08-15 ENCOUNTER — Other Ambulatory Visit (HOSPITAL_COMMUNITY): Payer: Self-pay

## 2018-08-26 ENCOUNTER — Ambulatory Visit (HOSPITAL_COMMUNITY)
Admission: RE | Admit: 2018-08-26 | Discharge: 2018-08-26 | Disposition: A | Discharge status: Home / Self Care | Payer: 59 | Source: Ambulatory Visit | Attending: Obstetrics and Gynecology | Admitting: Obstetrics and Gynecology

## 2018-08-26 ENCOUNTER — Encounter (HOSPITAL_COMMUNITY): Payer: Self-pay

## 2018-08-26 DIAGNOSIS — O3412 Maternal care for benign tumor of corpus uteri, second trimester: Secondary | ICD-10-CM | POA: Diagnosis not present

## 2018-08-26 DIAGNOSIS — O30039 Twin pregnancy, monochorionic/diamniotic, unspecified trimester: Secondary | ICD-10-CM | POA: Diagnosis present

## 2018-08-26 DIAGNOSIS — Z3A30 30 weeks gestation of pregnancy: Secondary | ICD-10-CM | POA: Insufficient documentation

## 2018-08-26 DIAGNOSIS — Z362 Encounter for other antenatal screening follow-up: Secondary | ICD-10-CM | POA: Diagnosis not present

## 2018-08-26 DIAGNOSIS — O99013 Anemia complicating pregnancy, third trimester: Secondary | ICD-10-CM

## 2018-08-26 DIAGNOSIS — O30033 Twin pregnancy, monochorionic/diamniotic, third trimester: Secondary | ICD-10-CM | POA: Diagnosis not present

## 2018-09-09 ENCOUNTER — Ambulatory Visit (HOSPITAL_COMMUNITY)
Admission: RE | Admit: 2018-09-09 | Discharge: 2018-09-09 | Disposition: A | Discharge status: Home / Self Care | Payer: PRIVATE HEALTH INSURANCE | Source: Ambulatory Visit | Attending: Obstetrics and Gynecology | Admitting: Obstetrics and Gynecology

## 2018-09-09 ENCOUNTER — Encounter (HOSPITAL_COMMUNITY): Payer: Self-pay

## 2018-09-09 ENCOUNTER — Other Ambulatory Visit (HOSPITAL_COMMUNITY): Payer: Self-pay | Admitting: Maternal & Fetal Medicine

## 2018-09-09 ENCOUNTER — Other Ambulatory Visit (HOSPITAL_COMMUNITY): Payer: Self-pay | Admitting: *Deleted

## 2018-09-09 DIAGNOSIS — Z3A32 32 weeks gestation of pregnancy: Secondary | ICD-10-CM

## 2018-09-09 DIAGNOSIS — O99013 Anemia complicating pregnancy, third trimester: Secondary | ICD-10-CM | POA: Diagnosis not present

## 2018-09-09 DIAGNOSIS — O30039 Twin pregnancy, monochorionic/diamniotic, unspecified trimester: Secondary | ICD-10-CM

## 2018-09-09 DIAGNOSIS — O3413 Maternal care for benign tumor of corpus uteri, third trimester: Secondary | ICD-10-CM | POA: Diagnosis not present

## 2018-09-09 DIAGNOSIS — O30033 Twin pregnancy, monochorionic/diamniotic, third trimester: Secondary | ICD-10-CM | POA: Diagnosis not present

## 2018-09-14 ENCOUNTER — Other Ambulatory Visit (HOSPITAL_COMMUNITY): Payer: Self-pay | Admitting: *Deleted

## 2018-09-14 DIAGNOSIS — O30039 Twin pregnancy, monochorionic/diamniotic, unspecified trimester: Secondary | ICD-10-CM

## 2018-09-16 ENCOUNTER — Encounter (HOSPITAL_COMMUNITY): Payer: Self-pay

## 2018-09-16 ENCOUNTER — Ambulatory Visit (HOSPITAL_COMMUNITY)
Admission: RE | Admit: 2018-09-16 | Discharge: 2018-09-16 | Disposition: A | Discharge status: Home / Self Care | Payer: 59 | Source: Ambulatory Visit | Attending: Obstetrics and Gynecology | Admitting: Obstetrics and Gynecology

## 2018-09-16 DIAGNOSIS — O30033 Twin pregnancy, monochorionic/diamniotic, third trimester: Secondary | ICD-10-CM

## 2018-09-16 DIAGNOSIS — O30039 Twin pregnancy, monochorionic/diamniotic, unspecified trimester: Secondary | ICD-10-CM | POA: Insufficient documentation

## 2018-09-16 DIAGNOSIS — Z3A33 33 weeks gestation of pregnancy: Secondary | ICD-10-CM | POA: Diagnosis not present

## 2018-09-16 DIAGNOSIS — O34211 Maternal care for low transverse scar from previous cesarean delivery: Secondary | ICD-10-CM | POA: Diagnosis not present

## 2018-09-16 DIAGNOSIS — O99013 Anemia complicating pregnancy, third trimester: Secondary | ICD-10-CM | POA: Diagnosis not present

## 2018-09-20 LAB — OB RESULTS CONSOLE GBS: GBS: POSITIVE

## 2018-09-21 ENCOUNTER — Other Ambulatory Visit (HOSPITAL_COMMUNITY): Payer: Self-pay | Admitting: *Deleted

## 2018-09-21 DIAGNOSIS — O30033 Twin pregnancy, monochorionic/diamniotic, third trimester: Secondary | ICD-10-CM

## 2018-09-22 ENCOUNTER — Other Ambulatory Visit (HOSPITAL_COMMUNITY): Payer: Self-pay | Admitting: *Deleted

## 2018-09-22 DIAGNOSIS — O30033 Twin pregnancy, monochorionic/diamniotic, third trimester: Secondary | ICD-10-CM

## 2018-09-23 ENCOUNTER — Encounter (HOSPITAL_COMMUNITY): Payer: Self-pay

## 2018-09-23 ENCOUNTER — Ambulatory Visit (HOSPITAL_COMMUNITY)
Admission: RE | Admit: 2018-09-23 | Discharge: 2018-09-23 | Disposition: A | Discharge status: Home / Self Care | Payer: 59 | Source: Ambulatory Visit | Attending: Obstetrics and Gynecology | Admitting: Obstetrics and Gynecology

## 2018-09-23 DIAGNOSIS — O99013 Anemia complicating pregnancy, third trimester: Secondary | ICD-10-CM

## 2018-09-23 DIAGNOSIS — Z3A34 34 weeks gestation of pregnancy: Secondary | ICD-10-CM

## 2018-09-23 DIAGNOSIS — O3413 Maternal care for benign tumor of corpus uteri, third trimester: Secondary | ICD-10-CM

## 2018-09-23 DIAGNOSIS — O30033 Twin pregnancy, monochorionic/diamniotic, third trimester: Secondary | ICD-10-CM | POA: Diagnosis not present

## 2018-09-23 DIAGNOSIS — O30039 Twin pregnancy, monochorionic/diamniotic, unspecified trimester: Secondary | ICD-10-CM | POA: Diagnosis not present

## 2018-09-30 ENCOUNTER — Encounter (HOSPITAL_COMMUNITY): Payer: Self-pay

## 2018-09-30 ENCOUNTER — Ambulatory Visit (HOSPITAL_COMMUNITY)
Admission: RE | Admit: 2018-09-30 | Discharge: 2018-09-30 | Disposition: A | Discharge status: Home / Self Care | Payer: 59 | Source: Ambulatory Visit | Attending: Obstetrics and Gynecology | Admitting: Obstetrics and Gynecology

## 2018-09-30 DIAGNOSIS — O30039 Twin pregnancy, monochorionic/diamniotic, unspecified trimester: Secondary | ICD-10-CM | POA: Insufficient documentation

## 2018-09-30 DIAGNOSIS — O99013 Anemia complicating pregnancy, third trimester: Secondary | ICD-10-CM

## 2018-09-30 DIAGNOSIS — O3411 Maternal care for benign tumor of corpus uteri, first trimester: Secondary | ICD-10-CM | POA: Diagnosis not present

## 2018-09-30 DIAGNOSIS — O30033 Twin pregnancy, monochorionic/diamniotic, third trimester: Secondary | ICD-10-CM

## 2018-09-30 DIAGNOSIS — Z3A35 35 weeks gestation of pregnancy: Secondary | ICD-10-CM

## 2018-10-06 ENCOUNTER — Encounter (HOSPITAL_COMMUNITY): Payer: Self-pay

## 2018-10-07 ENCOUNTER — Encounter (HOSPITAL_COMMUNITY): Payer: Self-pay

## 2018-10-07 ENCOUNTER — Ambulatory Visit (HOSPITAL_BASED_OUTPATIENT_CLINIC_OR_DEPARTMENT_OTHER)
Admission: RE | Admit: 2018-10-07 | Discharge: 2018-10-07 | Disposition: A | Discharge status: Home / Self Care | Payer: 59 | Source: Ambulatory Visit | Attending: Obstetrics and Gynecology | Admitting: Obstetrics and Gynecology

## 2018-10-07 DIAGNOSIS — D259 Leiomyoma of uterus, unspecified: Secondary | ICD-10-CM

## 2018-10-07 DIAGNOSIS — Z3A37 37 weeks gestation of pregnancy: Secondary | ICD-10-CM

## 2018-10-07 DIAGNOSIS — O30033 Twin pregnancy, monochorionic/diamniotic, third trimester: Secondary | ICD-10-CM

## 2018-10-07 DIAGNOSIS — O3411 Maternal care for benign tumor of corpus uteri, first trimester: Secondary | ICD-10-CM | POA: Diagnosis not present

## 2018-10-07 DIAGNOSIS — O99013 Anemia complicating pregnancy, third trimester: Secondary | ICD-10-CM

## 2018-10-07 DIAGNOSIS — O30039 Twin pregnancy, monochorionic/diamniotic, unspecified trimester: Secondary | ICD-10-CM

## 2018-10-08 ENCOUNTER — Inpatient Hospital Stay (HOSPITAL_COMMUNITY)
Admission: AD | Admit: 2018-10-08 | Discharge: 2018-10-11 | DRG: 787 | Disposition: A | Discharge status: Home / Self Care | Payer: 59 | Attending: Obstetrics and Gynecology | Admitting: Obstetrics and Gynecology

## 2018-10-08 ENCOUNTER — Other Ambulatory Visit: Payer: Self-pay

## 2018-10-08 ENCOUNTER — Encounter (HOSPITAL_COMMUNITY)
Admission: AD | Disposition: A | Discharge status: Home / Self Care | Payer: Self-pay | Source: Home / Self Care | Attending: Obstetrics and Gynecology

## 2018-10-08 ENCOUNTER — Encounter (HOSPITAL_COMMUNITY): Payer: Self-pay

## 2018-10-08 ENCOUNTER — Inpatient Hospital Stay (HOSPITAL_COMMUNITY): Payer: 59 | Admitting: Anesthesiology

## 2018-10-08 DIAGNOSIS — O321XX2 Maternal care for breech presentation, fetus 2: Secondary | ICD-10-CM | POA: Diagnosis present

## 2018-10-08 DIAGNOSIS — O322XX2 Maternal care for transverse and oblique lie, fetus 2: Secondary | ICD-10-CM

## 2018-10-08 DIAGNOSIS — O322XX1 Maternal care for transverse and oblique lie, fetus 1: Secondary | ICD-10-CM | POA: Diagnosis present

## 2018-10-08 DIAGNOSIS — O321XX1 Maternal care for breech presentation, fetus 1: Secondary | ICD-10-CM | POA: Diagnosis present

## 2018-10-08 DIAGNOSIS — O30033 Twin pregnancy, monochorionic/diamniotic, third trimester: Secondary | ICD-10-CM

## 2018-10-08 DIAGNOSIS — O3413 Maternal care for benign tumor of corpus uteri, third trimester: Secondary | ICD-10-CM | POA: Diagnosis present

## 2018-10-08 DIAGNOSIS — D259 Leiomyoma of uterus, unspecified: Secondary | ICD-10-CM | POA: Diagnosis present

## 2018-10-08 DIAGNOSIS — O9902 Anemia complicating childbirth: Secondary | ICD-10-CM | POA: Diagnosis present

## 2018-10-08 DIAGNOSIS — D649 Anemia, unspecified: Secondary | ICD-10-CM | POA: Diagnosis present

## 2018-10-08 DIAGNOSIS — O99824 Streptococcus B carrier state complicating childbirth: Secondary | ICD-10-CM | POA: Diagnosis present

## 2018-10-08 DIAGNOSIS — Z98891 History of uterine scar from previous surgery: Secondary | ICD-10-CM

## 2018-10-08 DIAGNOSIS — O42013 Preterm premature rupture of membranes, onset of labor within 24 hours of rupture, third trimester: Secondary | ICD-10-CM

## 2018-10-08 DIAGNOSIS — Z3A36 36 weeks gestation of pregnancy: Secondary | ICD-10-CM | POA: Diagnosis not present

## 2018-10-08 LAB — CBC
HCT: 36.2 % (ref 36.0–46.0)
HCT: 31.2 % — ABNORMAL LOW (ref 36.0–46.0)
HEMATOCRIT: 29.6 % — AB (ref 36.0–46.0)
Hemoglobin: 10.9 g/dL — ABNORMAL LOW (ref 12.0–15.0)
Hemoglobin: 9.2 g/dL — ABNORMAL LOW (ref 12.0–15.0)
Hemoglobin: 9 g/dL — ABNORMAL LOW (ref 12.0–15.0)
MCH: 23.7 pg — ABNORMAL LOW (ref 26.0–34.0)
MCH: 23.6 pg — ABNORMAL LOW (ref 26.0–34.0)
MCH: 23.9 pg — ABNORMAL LOW (ref 26.0–34.0)
MCHC: 30.1 g/dL (ref 30.0–36.0)
MCHC: 29.5 g/dL — ABNORMAL LOW (ref 30.0–36.0)
MCHC: 30.4 g/dL (ref 30.0–36.0)
MCV: 78.9 fL — ABNORMAL LOW (ref 80.0–100.0)
MCV: 80 fL (ref 80.0–100.0)
MCV: 78.5 fL — ABNORMAL LOW (ref 80.0–100.0)
Platelets: 114 10*3/uL — ABNORMAL LOW (ref 150–400)
Platelets: 103 10*3/uL — ABNORMAL LOW (ref 150–400)
Platelets: 102 10*3/uL — ABNORMAL LOW (ref 150–400)
RBC: 4.59 MIL/uL (ref 3.87–5.11)
RBC: 3.9 MIL/uL (ref 3.87–5.11)
RBC: 3.77 MIL/uL — ABNORMAL LOW (ref 3.87–5.11)
RDW: 20.4 % — ABNORMAL HIGH (ref 11.5–15.5)
RDW: 20.4 % — AB (ref 11.5–15.5)
RDW: 20.2 % — ABNORMAL HIGH (ref 11.5–15.5)
WBC: 8.2 10*3/uL (ref 4.0–10.5)
WBC: 12.9 10*3/uL — ABNORMAL HIGH (ref 4.0–10.5)
WBC: 16 10*3/uL — ABNORMAL HIGH (ref 4.0–10.5)
nRBC: 0 % (ref 0.0–0.2)
nRBC: 0 % (ref 0.0–0.2)
nRBC: 0.5 % — ABNORMAL HIGH (ref 0.0–0.2)

## 2018-10-08 LAB — DIC (DISSEMINATED INTRAVASCULAR COAGULATION)PANEL
Fibrinogen: 290 mg/dL (ref 210–475)
INR: 1.1
Platelets: 110 10*3/uL — ABNORMAL LOW (ref 150–400)
Prothrombin Time: 14.1 seconds (ref 11.4–15.2)
Smear Review: NONE SEEN
aPTT: 30 seconds (ref 24–36)

## 2018-10-08 LAB — POCT FERN TEST: POCT Fern Test: POSITIVE

## 2018-10-08 LAB — DIC (DISSEMINATED INTRAVASCULAR COAGULATION) PANEL: D DIMER QUANT: 7.42 ug{FEU}/mL — AB (ref 0.00–0.50)

## 2018-10-08 LAB — PREPARE RBC (CROSSMATCH)

## 2018-10-08 LAB — RPR: RPR Ser Ql: NONREACTIVE

## 2018-10-08 SURGERY — Surgical Case
Anesthesia: Spinal | Site: Abdomen | Wound class: Clean Contaminated

## 2018-10-08 MED ORDER — ZOLPIDEM TARTRATE 5 MG PO TABS: 5.0000 mg | ORAL_TABLET | Freq: Every evening | ORAL | Status: DC | PRN

## 2018-10-08 MED ORDER — NALOXONE HCL 0.4 MG/ML IJ SOLN: 0.4000 mg | INTRAMUSCULAR | Status: DC | PRN

## 2018-10-08 MED ORDER — ONDANSETRON HCL 4 MG/2ML IJ SOLN
INTRAMUSCULAR | Status: AC
  Filled 2018-10-08: qty 2

## 2018-10-08 MED ORDER — ONDANSETRON HCL 4 MG/2ML IJ SOLN: 4.0000 mg | Freq: Once | INTRAMUSCULAR | Status: DC | PRN

## 2018-10-08 MED ORDER — BETAMETHASONE SOD PHOS & ACET 6 (3-3) MG/ML IJ SUSP
12.0000 mg | Freq: Once | INTRAMUSCULAR | Status: AC
  Administered 2018-10-08: 12 mg via INTRAMUSCULAR
  Filled 2018-10-08: qty 2

## 2018-10-08 MED ORDER — SIMETHICONE 80 MG PO CHEW
80.0000 mg | CHEWABLE_TABLET | Freq: Three times a day (TID) | ORAL | Status: DC
  Administered 2018-10-08 – 2018-10-11 (×7): 80 mg via ORAL
  Filled 2018-10-08 (×7): qty 1

## 2018-10-08 MED ORDER — OXYTOCIN 40 UNITS IN NORMAL SALINE INFUSION - SIMPLE MED: 2.5000 [IU]/h | INTRAVENOUS | Status: AC

## 2018-10-08 MED ORDER — ONDANSETRON HCL 4 MG/2ML IJ SOLN
INTRAMUSCULAR | Status: DC | PRN
  Administered 2018-10-08: 4 mg via INTRAVENOUS

## 2018-10-08 MED ORDER — KETOROLAC TROMETHAMINE 30 MG/ML IJ SOLN: 30.0000 mg | Freq: Four times a day (QID) | INTRAMUSCULAR | Status: AC | PRN

## 2018-10-08 MED ORDER — HYDROMORPHONE HCL 1 MG/ML IJ SOLN
1.0000 mg | Freq: Once | INTRAMUSCULAR | Status: AC
  Administered 2018-10-08: 1 mg via INTRAVENOUS

## 2018-10-08 MED ORDER — HYDROMORPHONE HCL 1 MG/ML IJ SOLN
INTRAMUSCULAR | Status: AC
  Filled 2018-10-08: qty 1

## 2018-10-08 MED ORDER — NALBUPHINE HCL 10 MG/ML IJ SOLN: 5.0000 mg | Freq: Once | INTRAMUSCULAR | Status: DC | PRN

## 2018-10-08 MED ORDER — SCOPOLAMINE 1 MG/3DAYS TD PT72
1.0000 | MEDICATED_PATCH | Freq: Once | TRANSDERMAL | Status: DC
  Filled 2018-10-08: qty 1

## 2018-10-08 MED ORDER — PHENYLEPHRINE 8 MG IN D5W 100 ML (0.08MG/ML) PREMIX OPTIME
INJECTION | INTRAVENOUS | Status: AC
  Filled 2018-10-08: qty 100

## 2018-10-08 MED ORDER — HYDROCODONE-ACETAMINOPHEN 7.5-325 MG PO TABS: 1.0000 | ORAL_TABLET | Freq: Once | ORAL | Status: DC | PRN

## 2018-10-08 MED ORDER — LACTATED RINGERS IV SOLN
INTRAVENOUS | Status: DC
  Administered 2018-10-08 (×3): via INTRAVENOUS

## 2018-10-08 MED ORDER — BUPIVACAINE IN DEXTROSE 0.75-8.25 % IT SOLN
INTRATHECAL | Status: DC | PRN
  Administered 2018-10-08: 1.6 mL via INTRATHECAL

## 2018-10-08 MED ORDER — ACETAMINOPHEN 10 MG/ML IV SOLN
1000.0000 mg | Freq: Once | INTRAVENOUS | Status: DC | PRN
  Administered 2018-10-08: 1000 mg via INTRAVENOUS

## 2018-10-08 MED ORDER — TRANEXAMIC ACID-NACL 1000-0.7 MG/100ML-% IV SOLN
1000.0000 mg | INTRAVENOUS | Status: AC
  Administered 2018-10-08: 1000 mg via INTRAVENOUS

## 2018-10-08 MED ORDER — SENNOSIDES-DOCUSATE SODIUM 8.6-50 MG PO TABS
2.0000 | ORAL_TABLET | ORAL | Status: DC
  Administered 2018-10-08 – 2018-10-10 (×3): 2 via ORAL
  Filled 2018-10-08 (×3): qty 2

## 2018-10-08 MED ORDER — IBUPROFEN 800 MG PO TABS
800.0000 mg | ORAL_TABLET | Freq: Three times a day (TID) | ORAL | Status: DC
  Administered 2018-10-08 – 2018-10-11 (×8): 800 mg via ORAL
  Filled 2018-10-08 (×8): qty 1

## 2018-10-08 MED ORDER — DIPHENHYDRAMINE HCL 25 MG PO CAPS
25.0000 mg | ORAL_CAPSULE | ORAL | Status: DC | PRN
  Filled 2018-10-08: qty 1

## 2018-10-08 MED ORDER — DIPHENHYDRAMINE HCL 25 MG PO CAPS
25.0000 mg | ORAL_CAPSULE | Freq: Four times a day (QID) | ORAL | Status: DC | PRN
  Administered 2018-10-08 – 2018-10-09 (×2): 25 mg via ORAL
  Filled 2018-10-08 (×2): qty 1

## 2018-10-08 MED ORDER — MEPERIDINE HCL 25 MG/ML IJ SOLN: 6.2500 mg | INTRAMUSCULAR | Status: DC | PRN

## 2018-10-08 MED ORDER — SIMETHICONE 80 MG PO CHEW
80.0000 mg | CHEWABLE_TABLET | ORAL | Status: DC
  Administered 2018-10-08 – 2018-10-10 (×3): 80 mg via ORAL
  Filled 2018-10-08 (×3): qty 1

## 2018-10-08 MED ORDER — FERROUS SULFATE 325 (65 FE) MG PO TABS
325.0000 mg | ORAL_TABLET | Freq: Two times a day (BID) | ORAL | Status: DC
  Administered 2018-10-08 – 2018-10-11 (×6): 325 mg via ORAL
  Filled 2018-10-08 (×6): qty 1

## 2018-10-08 MED ORDER — COCONUT OIL OIL: 1.0000 "application " | TOPICAL_OIL | Status: DC | PRN

## 2018-10-08 MED ORDER — SOD CITRATE-CITRIC ACID 500-334 MG/5ML PO SOLN
30.0000 mL | Freq: Once | ORAL | Status: AC
  Administered 2018-10-08: 30 mL via ORAL
  Filled 2018-10-08: qty 15

## 2018-10-08 MED ORDER — NALOXONE HCL 4 MG/10ML IJ SOLN
1.0000 ug/kg/h | INTRAVENOUS | Status: DC | PRN
  Filled 2018-10-08: qty 5

## 2018-10-08 MED ORDER — DIPHENHYDRAMINE HCL 50 MG/ML IJ SOLN
12.5000 mg | INTRAMUSCULAR | Status: DC | PRN
  Administered 2018-10-08: 12.5 mg via INTRAVENOUS

## 2018-10-08 MED ORDER — CEFAZOLIN SODIUM-DEXTROSE 2-4 GM/100ML-% IV SOLN
2.0000 g | Freq: Three times a day (TID) | INTRAVENOUS | Status: AC
  Administered 2018-10-08 (×2): 2 g via INTRAVENOUS
  Filled 2018-10-08 (×2): qty 100

## 2018-10-08 MED ORDER — LACTATED RINGERS IV SOLN
INTRAVENOUS | Status: DC
  Administered 2018-10-08 – 2018-10-09 (×3): via INTRAVENOUS

## 2018-10-08 MED ORDER — OXYTOCIN 10 UNIT/ML IJ SOLN
INTRAVENOUS | Status: DC | PRN
  Administered 2018-10-08: 40 [IU] via INTRAVENOUS

## 2018-10-08 MED ORDER — ALBUMIN HUMAN 5 % IV SOLN
INTRAVENOUS | Status: AC
  Filled 2018-10-08: qty 250

## 2018-10-08 MED ORDER — MORPHINE SULFATE (PF) 0.5 MG/ML IJ SOLN
INTRAMUSCULAR | Status: AC
  Filled 2018-10-08: qty 10

## 2018-10-08 MED ORDER — OXYTOCIN 10 UNIT/ML IJ SOLN
INTRAMUSCULAR | Status: AC
  Filled 2018-10-08: qty 4

## 2018-10-08 MED ORDER — METHYLERGONOVINE MALEATE 0.2 MG/ML IJ SOLN
INTRAMUSCULAR | Status: AC
  Filled 2018-10-08: qty 1

## 2018-10-08 MED ORDER — LACTATED RINGERS IV SOLN
INTRAVENOUS | Status: DC | PRN
  Administered 2018-10-08: 06:00:00 via INTRAVENOUS

## 2018-10-08 MED ORDER — DEXAMETHASONE SODIUM PHOSPHATE 10 MG/ML IJ SOLN
INTRAMUSCULAR | Status: AC
  Filled 2018-10-08: qty 1

## 2018-10-08 MED ORDER — SODIUM CHLORIDE 0.9% FLUSH: 3.0000 mL | INTRAVENOUS | Status: DC | PRN

## 2018-10-08 MED ORDER — NALBUPHINE HCL 10 MG/ML IJ SOLN: 5.0000 mg | INTRAMUSCULAR | Status: DC | PRN

## 2018-10-08 MED ORDER — METHYLERGONOVINE MALEATE 0.2 MG/ML IJ SOLN
0.2000 mg | Freq: Once | INTRAMUSCULAR | Status: AC
  Administered 2018-10-08: 0.2 mg via INTRAMUSCULAR

## 2018-10-08 MED ORDER — DEXAMETHASONE SODIUM PHOSPHATE 10 MG/ML IJ SOLN
INTRAMUSCULAR | Status: DC | PRN
  Administered 2018-10-08: 10 mg via INTRAVENOUS

## 2018-10-08 MED ORDER — SIMETHICONE 80 MG PO CHEW: 80.0000 mg | CHEWABLE_TABLET | ORAL | Status: DC | PRN

## 2018-10-08 MED ORDER — PHENYLEPHRINE 8 MG IN D5W 100 ML (0.08MG/ML) PREMIX OPTIME
INJECTION | INTRAVENOUS | Status: DC | PRN
  Administered 2018-10-08: 60 ug/min via INTRAVENOUS

## 2018-10-08 MED ORDER — MENTHOL 3 MG MT LOZG: 1.0000 | LOZENGE | OROMUCOSAL | Status: DC | PRN

## 2018-10-08 MED ORDER — LACTATED RINGERS IV SOLN: INTRAVENOUS | Status: DC

## 2018-10-08 MED ORDER — GABAPENTIN 300 MG PO CAPS
900.0000 mg | ORAL_CAPSULE | ORAL | Status: DC
  Filled 2018-10-08: qty 3

## 2018-10-08 MED ORDER — MORPHINE SULFATE (PF) 0.5 MG/ML IJ SOLN
INTRAMUSCULAR | Status: DC | PRN
  Administered 2018-10-08: .15 mg via INTRATHECAL

## 2018-10-08 MED ORDER — TRANEXAMIC ACID-NACL 1000-0.7 MG/100ML-% IV SOLN
INTRAVENOUS | Status: AC
  Filled 2018-10-08: qty 100

## 2018-10-08 MED ORDER — WITCH HAZEL-GLYCERIN EX PADS: 1.0000 "application " | MEDICATED_PAD | CUTANEOUS | Status: DC | PRN

## 2018-10-08 MED ORDER — MISOPROSTOL 200 MCG PO TABS
ORAL_TABLET | ORAL | Status: AC
  Administered 2018-10-08: 800 ug
  Filled 2018-10-08: qty 4

## 2018-10-08 MED ORDER — CEFAZOLIN SODIUM-DEXTROSE 2-4 GM/100ML-% IV SOLN
2.0000 g | INTRAVENOUS | Status: AC
  Administered 2018-10-08: 2 g via INTRAVENOUS
  Filled 2018-10-08: qty 100

## 2018-10-08 MED ORDER — DIPHENHYDRAMINE HCL 50 MG/ML IJ SOLN
INTRAMUSCULAR | Status: AC
  Filled 2018-10-08: qty 1

## 2018-10-08 MED ORDER — DIBUCAINE 1 % RE OINT: 1.0000 "application " | TOPICAL_OINTMENT | RECTAL | Status: DC | PRN

## 2018-10-08 MED ORDER — OXYCODONE HCL 5 MG PO TABS
5.0000 mg | ORAL_TABLET | ORAL | Status: DC | PRN
  Administered 2018-10-09: 5 mg via ORAL
  Filled 2018-10-08: qty 1

## 2018-10-08 MED ORDER — ACETAMINOPHEN 10 MG/ML IV SOLN
INTRAVENOUS | Status: AC
  Filled 2018-10-08: qty 100

## 2018-10-08 MED ORDER — LACTATED RINGERS IV BOLUS
1000.0000 mL | Freq: Once | INTRAVENOUS | Status: AC
  Administered 2018-10-08: 1000 mL via INTRAVENOUS

## 2018-10-08 MED ORDER — HYDROMORPHONE HCL 1 MG/ML IJ SOLN: 0.2500 mg | INTRAMUSCULAR | Status: DC | PRN

## 2018-10-08 MED ORDER — FENTANYL CITRATE (PF) 100 MCG/2ML IJ SOLN
INTRAMUSCULAR | Status: AC
  Filled 2018-10-08: qty 2

## 2018-10-08 MED ORDER — FAMOTIDINE IN NACL 20-0.9 MG/50ML-% IV SOLN
20.0000 mg | Freq: Once | INTRAVENOUS | Status: AC
  Administered 2018-10-08: 20 mg via INTRAVENOUS
  Filled 2018-10-08: qty 50

## 2018-10-08 MED ORDER — ONDANSETRON HCL 4 MG/2ML IJ SOLN: 4.0000 mg | Freq: Three times a day (TID) | INTRAMUSCULAR | Status: DC | PRN

## 2018-10-08 MED ORDER — ALBUMIN HUMAN 5 % IV SOLN
INTRAVENOUS | Status: DC | PRN
  Administered 2018-10-08: 09:00:00 via INTRAVENOUS

## 2018-10-08 MED ORDER — PRENATAL MULTIVITAMIN CH
1.0000 | ORAL_TABLET | Freq: Every day | ORAL | Status: DC
  Administered 2018-10-10: 1 via ORAL
  Filled 2018-10-08: qty 1

## 2018-10-08 MED ORDER — FENTANYL CITRATE (PF) 100 MCG/2ML IJ SOLN
INTRAMUSCULAR | Status: DC | PRN
  Administered 2018-10-08: 15 ug via INTRATHECAL

## 2018-10-08 MED ORDER — SOD CITRATE-CITRIC ACID 500-334 MG/5ML PO SOLN: 30.0000 mL | Freq: Once | ORAL | Status: DC

## 2018-10-08 MED ORDER — OXYTOCIN 40 UNITS IN NORMAL SALINE INFUSION - SIMPLE MED
INTRAVENOUS | Status: AC
  Filled 2018-10-08: qty 1000

## 2018-10-08 MED ORDER — TETANUS-DIPHTH-ACELL PERTUSSIS 5-2.5-18.5 LF-MCG/0.5 IM SUSP: 0.5000 mL | Freq: Once | INTRAMUSCULAR | Status: DC

## 2018-10-08 SURGICAL SUPPLY — 39 items
BENZOIN TINCTURE PRP APPL 2/3 (GAUZE/BANDAGES/DRESSINGS) ×3 IMPLANT
CHLORAPREP W/TINT 26ML (MISCELLANEOUS) ×3 IMPLANT
CLAMP CORD UMBIL (MISCELLANEOUS) IMPLANT
CLOSURE WOUND 1/2 X4 (GAUZE/BANDAGES/DRESSINGS) ×1
CLOTH BEACON ORANGE TIMEOUT ST (SAFETY) ×3 IMPLANT
DRAPE C SECTION CLR SCREEN (DRAPES) ×6 IMPLANT
DRSG OPSITE POSTOP 4X10 (GAUZE/BANDAGES/DRESSINGS) ×3 IMPLANT
ELECT REM PT RETURN 9FT ADLT (ELECTROSURGICAL) ×3
ELECTRODE REM PT RTRN 9FT ADLT (ELECTROSURGICAL) ×1 IMPLANT
EXTRACTOR VACUUM KIWI (MISCELLANEOUS) IMPLANT
GLOVE BIO SURGEON STRL SZ 6.5 (GLOVE) ×4 IMPLANT
GLOVE BIO SURGEONS STRL SZ 6.5 (GLOVE) ×2
GLOVE BIOGEL PI IND STRL 7.0 (GLOVE) ×6 IMPLANT
GLOVE BIOGEL PI INDICATOR 7.0 (GLOVE) ×12
GOWN STRL REUS W/TWL LRG LVL3 (GOWN DISPOSABLE) ×9 IMPLANT
KIT ABG SYR 3ML LUER SLIP (SYRINGE) IMPLANT
NEEDLE HYPO 25X5/8 SAFETYGLIDE (NEEDLE) IMPLANT
NS IRRIG 1000ML POUR BTL (IV SOLUTION) ×3 IMPLANT
PACK C SECTION WH (CUSTOM PROCEDURE TRAY) ×3 IMPLANT
PAD OB MATERNITY 4.3X12.25 (PERSONAL CARE ITEMS) ×3 IMPLANT
RETRACTOR WND ALEXIS 25 LRG (MISCELLANEOUS) ×1 IMPLANT
RTRCTR C-SECT PINK 25CM LRG (MISCELLANEOUS) IMPLANT
RTRCTR WOUND ALEXIS 25CM LRG (MISCELLANEOUS) ×3
STRIP CLOSURE SKIN 1/2X4 (GAUZE/BANDAGES/DRESSINGS) ×2 IMPLANT
SUT CHROMIC 1 CTX 36 (SUTURE) ×6 IMPLANT
SUT PLAIN 0 NONE (SUTURE) IMPLANT
SUT PLAIN 2 0 XLH (SUTURE) ×6 IMPLANT
SUT VIC AB 0 CT1 27 (SUTURE) ×4
SUT VIC AB 0 CT1 27XBRD ANBCTR (SUTURE) ×2 IMPLANT
SUT VIC AB 2-0 CT1 27 (SUTURE) ×2
SUT VIC AB 2-0 CT1 TAPERPNT 27 (SUTURE) ×1 IMPLANT
SUT VIC AB 3-0 CT1 27 (SUTURE)
SUT VIC AB 3-0 CT1 TAPERPNT 27 (SUTURE) IMPLANT
SUT VIC AB 3-0 SH 27 (SUTURE) ×2
SUT VIC AB 3-0 SH 27X BRD (SUTURE) ×1 IMPLANT
SUT VIC AB 4-0 KS 27 (SUTURE) ×6 IMPLANT
TOWEL OR 17X24 6PK STRL BLUE (TOWEL DISPOSABLE) ×3 IMPLANT
TRAY FOLEY W/BAG SLVR 14FR LF (SET/KITS/TRAYS/PACK) ×3 IMPLANT
WATER STERILE IRR 1000ML POUR (IV SOLUTION) ×3 IMPLANT

## 2018-10-08 NOTE — Progress Notes (Signed)
Patient ID: Wanda Coffey, female   DOB: 07/29/1986, 33 y.o.   MRN: [id number] Recheck with fundus unchanged. Still firm and deviated to right at umb+1 No clots in vagina, small amount in LUS  afeb vss Hgb 9.0 on recheck with coags pending as baseline  Will keep foley in place but allow to go to postpartum floor Has hgb ordered for AM and will leave the foley in until we see how patient ambulates

## 2018-10-08 NOTE — H&P (Signed)
Wanda Coffey is a 33 y.o. female G2 P1001 at 85 3/7 weeks mono/di pregnancy (EDD 11/02/18 by 7 week [place] inconsistent with LMP) presenting for SROM and active labor with malpresentation of twin A--back down/transverse lie. Prenatal care significant for the mono/di twin pregnancy.  Originally concern for mono/mono twins, but this was ruled out.  She has been followed by growth [place] and appropriate antenatal testing since 32 weeks. She has fibroids that did not cause any issues with the pregnancy.  She was anemic with iron as low as 8.9 but responded to oral iron therapy.  OB History    Gravida  2   Para  1   Term  1   Preterm      AB      Living  1     SAB      TAB      Ectopic      Multiple  0   Live Births  1         2018 Vacuum vaginal delivery 8#  Past Medical History:  Diagnosis Date  . Anemia   . Fibroid    Past Surgical History:  Procedure Laterality Date  . NO PAST SURGERIES    . WISDOM TOOTH EXTRACTION     Family History: family history includes Cancer in her father; Heart disease in her father; Thyroid disease in her sister. Social History:  reports that she has never smoked. She has never used smokeless tobacco. She reports that she does not drink alcohol or use drugs.     Maternal Diabetes: No Genetic Screening: Declined Maternal Ultrasounds/Referrals: Normal Fetal Ultrasounds or other Referrals:  Referred to Materal Fetal Medicine  Maternal Substance Abuse:  No Significant Maternal Medications:  None Significant Maternal Lab Results:  Lab values include: Group B Strep positive Other Comments:  None  Review of Systems  Constitutional: Negative for fever.  Gastrointestinal: Positive for abdominal pain.  Neurological: Negative for headaches.   Maternal Medical History:  Reason for admission: Rupture of membranes.   Contractions: Onset was 3-5 hours ago.   Frequency: regular.   Perceived severity is moderate.    Fetal activity: Perceived fetal  activity is normal.    Prenatal complications: No bleeding.   Prenatal Complications - Diabetes: none.    Dilation: 7 Effacement (%): 70 Station: -2 Exam by:: Leftwich-Kirby, CNM Blood pressure 123/66, pulse 86, temperature (!) 97.5 F (36.4 C), temperature source Oral, resp. rate 18, last menstrual period 01/19/2018, SpO2 100 %, unknown if currently breastfeeding. Maternal Exam:  Uterine Assessment: Contraction strength is mild.  Contraction frequency is regular.   Abdomen: Patient reports no abdominal tenderness. Introitus: Normal vagina.    Fetal Exam Fetal Monitor Review: Variability: moderate (6-25 bpm).    Fetal State Assessment: Category I - tracings are normal.     Physical Exam  Constitutional: She appears well-developed.  Cardiovascular: Normal rate and regular rhythm.  Respiratory: Effort normal.  GI: Soft.  Genitourinary:    Vagina normal.     Genitourinary Comments: Baby A presenting transverse, back down, Head to maternal right   Neurological: She is alert.  Psychiatric: She has a normal mood and affect.    Prenatal labs: ABO, Rh: --/--/O POS (02/01 9357) Antibody: NEG (02/01 0517) Rubella: Immune (12/03 0000) RPR: Nonreactive (12/03 0000)  HBsAg: Negative (12/03 0000)  HIV: Non-reactive (12/03 0000)  GBS: Positive (01/14 0000)  One hour GCT 98  Assessment/Plan: Patient was prepped urgently for OR given her advanced dilation, active  labor and malpresentation. Proceeded with c-section uneventfully--see Op Note.   Logan Bores 10/08/2018, 7:16 AM

## 2018-10-08 NOTE — Anesthesia Preprocedure Evaluation (Addendum)
Anesthesia Evaluation  Patient identified by MRN, date of birth, ID band Patient awake    Reviewed: Allergy & Precautions, NPO status , Patient's Chart, lab work & pertinent test results  Airway Mallampati: II  TM Distance: >3 FB Neck ROM: Full    Dental no notable dental hx. (+) Teeth Intact   Pulmonary neg pulmonary ROS,    Pulmonary exam normal breath sounds clear to auscultation       Cardiovascular Exercise Tolerance: Good negative cardio ROS Normal cardiovascular exam Rhythm:Regular Rate:Normal     Neuro/Psych negative neurological ROS     GI/Hepatic   Endo/Other    Renal/GU      Musculoskeletal   Abdominal   Peds  Hematology   Anesthesia Other Findings   Reproductive/Obstetrics (+) Pregnancy Monochromic Twins                             Lab Results  Component Value Date   WBC 8.2 10/08/2018   HGB 10.9 (L) 10/08/2018   HCT 36.2 10/08/2018   MCV 78.9 (L) 10/08/2018   PLT 114 (L) 10/08/2018    Anesthesia Physical Anesthesia Plan  ASA: III  Anesthesia Plan: Spinal   Post-op Pain Management:    Induction:   PONV Risk Score and Plan: 2 and Treatment may vary due to age or medical condition, Ondansetron and Dexamethasone  Airway Management Planned: Nasal Cannula and Natural Airway  Additional Equipment:   Intra-op Plan:   Post-operative Plan:   Informed Consent: I have reviewed the patients History and Physical, chart, labs and discussed the procedure including the risks, benefits and alternatives for the proposed anesthesia with the patient or authorized representative who has indicated his/her understanding and acceptance.     Dental advisory given  Plan Discussed with:   Anesthesia Plan Comments: (Monochromic Twins T & C 2 units)       Anesthesia Quick Evaluation

## 2018-10-08 NOTE — Op Note (Signed)
Operative Note    Preoperative Diagnosis Preterm monochorionic/diamniotic twin pregnancy at 52 3/7 weeks Malpresentation of twin A-transverse/back down Rupture of membranes and active labor   Postoperative Diagnosis Same with multiple small fibroids in uterus  Procedure Primary low transverse c-section with 2 layer closure of uterus  Surgeon Paula Compton, MD  Anesthesia Spinal  Fluids: EBL 1215mL UOP 251mL clear IVF 2262mL LR  Findings Viable female infants x 2.  Baby A transverse, delivered footling breech.  Apgars 8,9.  Weight pending.  Baby B footling breech Apgars 7,9.  Weight pending  Uterus with several fibroids, one serosal at upper right fundus 3 cm.  Normal tubes and ovaries  Specimen Placenta to Pathology  Procedure Note Patient was taken to the operating room where epidural anesthesia was found to be adequate by Allis clamp test. She was prepped and draped in the normal sterile fashion in the dorsal supine position with a leftward tilt. An appropriate time out was performed. A Pfannenstiel skin incision was then made with the scalpel and carried through to the underlying layer of fascia by sharp dissection and Bovie cautery. The fascia was nicked in the midline and the incision was extended laterally with Mayo scissors. The inferior aspect of the incision was grasped Coker clamps and dissected off the underlying rectus muscles. In a similar fashion the superior aspect was dissected off the rectus muscles. Rectus muscles were separated in the midline and the peritoneal cavity entered bluntly. The peritoneal incision was then extended both superiorly and inferiorly with careful attention to avoid both bowel and bladder. The Alexis self-retaining wound retractor was then placed within the incision and the lower uterine segment exposed. The bladder flap was developed with Metzenbaum scissors and pushed away from the lower uterine segment. The lower uterine segment was  then incised in a transverse fashion and the cavity itself entered bluntly. The incision was extended bluntly. Baby A was noted to be transverse/back down with the head to the maternal right.  The feet were identified and with gentle traction brought down through the incision and the infant delivered with the arms swept across the body and the head flexed.  The baby was handed off to peds after delayed cord clamping.  Baby B was then palpated with membranes intact and noted to be behind the placenta.  The feet were identified and grasped then membranes ruptured,  The baby was brought down easily with gentle traction and the head delivered flexed with fundal pressure needed.    The infant was handed off to the waiting pediatricians as it was a bit floppy.  . The placenta was then spontaneously expressed from the uterus and the uterus cleared of all clots and debris with moist lap sponge. The uterine incision was then repaired in 2 layers the first layer was a running locked layer 1-0 chromic and the second an imbricating layer of the same suture. The tubes and ovaries were inspected and the gutters cleared of all clots and debris. The uterine incision was inspected and found to be hemostatic. All instruments and sponges as well as the Alexis retractor were then removed from the abdomen. The rectus muscles and peritoneum were then reapproximated with a running suture of 2-0 Vicryl. The fascia was then closed with 0 Vicryl in a running fashion. Subcutaneous tissue was reapproximated with 3-0 plain in a running fashion. The skin was closed with a subcuticular stitch of 4-0 Vicryl on a Keith needle and then reinforced with benzoin and Steri-Strips. At  the conclusion of the procedure all instruments and sponge counts were correct. Patient was taken to the recovery room in good condition with her baby accompanying her skin to skin.

## 2018-10-08 NOTE — Progress Notes (Signed)
0825 Code Hemorrhage Stage 1 called. Multiple personnel responded. FF multiple sm-large clots expressed by Dr Marvel Plan. QBL in PACU at 753. Lab at bs to draw CBC.Starting 2nd line.

## 2018-10-08 NOTE — Progress Notes (Signed)
Patient ID: Wanda Coffey, female   DOB: August 16, 1986, 33 y.o.   MRN: [id number] Pt has remained stable on two checks 30 minutes apart.  With no further clots or heavy bleeding.  Bimanual with small amount of clot in LUS but none in vagina.  Fundus firm and slightly deviated to right c/w leiomyomas.   Umbilicus +1  Pt stable and atony improved.  No dizziness or sx sitting up.   Will recheck Hgb and coags to get a better representation of deficit.  Recheck in one hour and if labs stable and bleeding stable will allow to go to floor.

## 2018-10-08 NOTE — MAU Provider Note (Signed)
Chief Complaint:  Rupture of Membranes and Contractions   None     HPI: Wanda Coffey is a 33 y.o. G2P1001 at [redacted]w[redacted]d by early ultrasound who presents to maternity admissions reporting painful regular contractions and leakage of clear fluid starting at 0330 this morning, 10/08/18.  Contractions started after and have become progressively more painful.  Pt reports she desires a vaginal delivery if the babies positions cooperate but they have been breech/transverse on recent scans.  Her contractions are low in her abdomen and radiate to her low back. They are associated with pelvic pressure.  She has not tried any treatments. Fluid leakage has been clear.  She reports good fetal movement x 2.  She last ate at midnight, only a few grapes, last meal at 6:30 pm on 1/31.  HPI  Past Medical History: Past Medical History:  Diagnosis Date  . Anemia   . Fibroid     Past obstetric history: OB History  Gravida Para Term Preterm AB Living  2 1 1     1   SAB TAB Ectopic Multiple Live Births        0 1    # Outcome Date GA Lbr Len/2nd Weight Sex Delivery Anes PTL Lv  2 Current           1 Term 06/12/17 [redacted]w[redacted]d 00:13 / 00:14 3665 g M Vag-Vacuum Pud  LIV    Past Surgical History: Past Surgical History:  Procedure Laterality Date  . NO PAST SURGERIES    . WISDOM TOOTH EXTRACTION      Family History: Family History  Problem Relation Age of Onset  . Cancer Father   . Heart disease Father   . Thyroid disease Sister     Social History: Social History   Tobacco Use  . Smoking status: Never Smoker  . Smokeless tobacco: Never Used  Substance Use Topics  . Alcohol use: No  . Drug use: No    Allergies: No Known Allergies  Meds:  Medications Prior to Admission  Medication Sig Dispense Refill Last Dose  . ferrous sulfate 325 (65 FE) MG tablet Take 325 mg by mouth 2 (two) times daily.  5 10/07/2018 at Unknown time  . Prenatal Vit-Fe Fumarate-FA (PRENATAL MULTIVITAMIN) TABS tablet Take 1 tablet  by mouth daily at 12 noon.   10/07/2018 at Unknown time  . Probiotic Product (PROBIOTIC PO) Take 1 capsule by mouth daily.    10/07/2018 at Unknown time  . ibuprofen (ADVIL,MOTRIN) 600 MG tablet Take 1 tablet (600 mg total) by mouth every 6 (six) hours as needed. (Patient not taking: Reported on 05/20/2018) 30 tablet 1 Not Taking    ROS:  Review of Systems  Constitutional: Negative for chills, fatigue and fever.  Eyes: Negative for visual disturbance.  Respiratory: Negative for shortness of breath.   Cardiovascular: Negative for chest pain.  Gastrointestinal: Positive for abdominal pain. Negative for nausea and vomiting.  Genitourinary: Positive for pelvic pain and vaginal discharge. Negative for difficulty urinating, dysuria, flank pain, vaginal bleeding and vaginal pain.  Musculoskeletal: Positive for back pain.  Neurological: Negative for dizziness and headaches.  Psychiatric/Behavioral: Negative.      I have reviewed patient's Past Medical Hx, Surgical Hx, Family Hx, Social Hx, medications and allergies.   Physical Exam   Patient Vitals for the past 24 hrs:  BP Temp Temp src Pulse Resp SpO2  10/08/18 0433 123/66 - - - - -  10/08/18 0428 - (!) 97.5 F (36.4 C) Oral 86 18  100 %   Constitutional: Well-developed, well-nourished female in no acute distress.  Cardiovascular: normal rate Respiratory: normal effort GI: Abd soft, non-tender, gravid appropriate for gestational age.  MS: Extremities nontender, no edema, normal ROM Neurologic: Alert and oriented x 4.  GU: Neg CVAT.   Dilation: 7 Effacement (%): 70 Cervical Position: Middle Station: -2 Presentation: Transverse Exam by:: Leftwich-Kirby, CNM  FHT Baby A:  Baseline 140, moderate variability, accelerations present, no decelerations FHT Baby B:  Baseline 150, moderate variability, accelerations present, no decelerations Contractions: q 4-5 mins   Labs: No results found for this or any previous visit (from the past 24  hour(s)). O/Positive/-- (12/03 0000)  Imaging:    MAU Course/MDM: Orders Placed This Encounter  Procedures  . CBC  . RPR  . Diet NPO time specified Except for: Sips with Meds  . Contraction - monitoring  . External fetal heart monitoring  . Vaginal exam  . Anesthesia per Enhanced Recovery Pathway  . Informed Consent Details: Transcribe to consent form and obtain patient signature  . Initiate Pre-op Protocol  .  Sequential compression device (SCD's) to OR  . Clip operative site  . Instruct patient regarding incentive spirometry  . Verify informed consent  . POCT fern test  . Type and screen [place]  . Insert peripheral IV    Meds ordered this encounter  Medications  . lactated ringers infusion  . ceFAZolin (ANCEF) IVPB 2g/100 mL premix    Order Specific Question:   Indication:    Answer:   Surgical Prophylaxis  . gabapentin (NEURONTIN) capsule 900 mg  . betamethasone acetate-betamethasone sodium phosphate (CELESTONE) injection 12 mg     FHR Category I x 2 with Baby A baseline 140 and Baby B 150 Pt with advanced dilation and malposition of Baby A confirmed by [place] Admission to OR for repeat C/S, called Dr Marvel Plan who is on her way. Consult Dr Kennon Rounds who is on standby until Dr Marvel Plan arrives.    Assessment: 1. Monochorionic diamniotic twin gestation in third trimester   2. Preterm premature rupture of membranes (PPROM) with onset of labor within 24 hours of rupture in third trimester, antepartum   3. Transverse lie of fetus, fetus 1 of multiple gestation   4. Transverse lie of fetus, fetus 2 of multiple gestation     Plan: Admit to [place] x 1    Fatima Blank Certified Nurse-Midwife 10/08/2018 5:25 AM

## 2018-10-08 NOTE — MAU Provider Note (Addendum)
Pt seen in MAU and examined  Confirmed in active labor with cervix 7cm and baby A transverse lie with back presenting, head to maternal left  FHR category 1 x 2  Mono/Di twins in labor with malpresentation, SROM at 330am.  D/w pt need to proceed with c-section and she is agreeable.  Risks of bleeding, infection and possible damage to bowel and bladder reviewed.  She would accept a blood transfusion if needed and has been anemic most of pregnancy.  Platelets 114K and Hgb 10.9.  MAU notified to change type and screen to T&C of 2 units.d/w parents NICU present and would assess babies at time of section. Per office chart GBS +

## 2018-10-08 NOTE — Anesthesia Procedure Notes (Signed)
Spinal  Patient location during procedure: OB Start time: 10/08/2018 5:59 AM End time: 10/08/2018 6:03 AM Staffing Anesthesiologist: Barnet Glasgow, MD Performed: anesthesiologist  Preanesthetic Checklist Completed: patient identified, surgical consent, pre-op evaluation, timeout performed, IV checked, risks and benefits discussed and monitors and equipment checked Spinal Block Patient position: sitting Prep: site prepped and draped and DuraPrep Patient monitoring: heart rate, cardiac monitor, continuous pulse ox and blood pressure Approach: midline Location: L3-4 Injection technique: single-shot Needle Needle type: Pencan  Needle gauge: 24 G Needle length: 10 cm Needle insertion depth: 5 cm Assessment Sensory level: T4 Additional Notes 1 Attempt (s). Pt tolerated procedure well.

## 2018-10-08 NOTE — Transfer of Care (Signed)
Immediate Anesthesia Transfer of Care Note  Patient: Wanda Coffey  Procedure(s) Performed: CESAREAN SECTION MULTI-GESTATIONAL (N/A Abdomen)  Patient Location: PACU  Anesthesia Type:Spinal  Level of Consciousness: awake, alert  and oriented  Airway & Oxygen Therapy: Patient Spontanous Breathing  Post-op Assessment: Report given to RN and Post -op Vital signs reviewed and stable  Post vital signs: Reviewed and stable  Last Vitals:  Vitals Value Taken Time  BP    Temp    Pulse    Resp    SpO2      Last Pain:  Vitals:   10/08/18 0436  TempSrc:   PainSc: 7          Complications: No apparent anesthesia complications

## 2018-10-08 NOTE — Anesthesia Postprocedure Evaluation (Signed)
Anesthesia Post Note  Patient: Wanda Coffey  Procedure(s) Performed: CESAREAN SECTION MULTI-GESTATIONAL (N/A Abdomen)     Patient location during evaluation: Mother Baby Anesthesia Type: Spinal Level of consciousness: awake Pain management: pain level controlled Vital Signs Assessment: post-procedure vital signs reviewed and stable Respiratory status: spontaneous breathing Cardiovascular status: stable Postop Assessment: no apparent nausea or vomiting Anesthetic complications: no    Last Vitals:  Vitals:   10/08/18 1851 10/08/18 1945  BP:  113/70  Pulse:  78  Resp:  18  Temp:  37.3 C  SpO2: 99%     Last Pain:  Vitals:   10/08/18 1945  TempSrc: Oral  PainSc: 4    Pain Goal: Patients Stated Pain Goal: 3 (10/08/18 1430)              Epidural/Spinal Function Cutaneous sensation: Normal sensation (10/08/18 1945), Patient able to flex knees: Yes (10/08/18 1945), Patient able to lift hips off bed: Yes (10/08/18 1945), Back pain beyond tenderness at insertion site: No (10/08/18 1945), Progressively worsening motor and/or sensory loss: No (10/08/18 1945), Bowel and/or bladder incontinence post epidural: No (10/08/18 1945)  Huston Foley

## 2018-10-08 NOTE — MAU Note (Signed)
Pt SROM clear pink fluid. Contractions started after.+FM. Scheduled for c-sect.

## 2018-10-08 NOTE — Lactation Note (Signed)
This note was copied from a baby's chart. Lactation Consultation Note:  P2, Mother has a 17 month old at home that she breastfed for 13 months. Mother has Twin girls,that are 62 hours old.   Mother attempting to breastfeed Baby A when I arrived in the room. Assist with hand expression. Infant began cuing and latched.  Mother taught breast compression.  Infant sustained latch for 20 mins.  Infant then fed 5 ml of ebm with a spoon.   Baby B, latched on the alternate breast in football hold.  Infant sustained latch with observed suckles and frequent swallows. Infant sustained latch for 15 mins. Infant then spoon fed 4 ml of ebm with a spoon.  Reviewed LPI guidelines and advised mother of supplemental amts.   Staff nurse sat up DEBP and explained use of the pump, collection and storage of breastmilk.   Mother very sleepy and to page staff nurse when ready to initiate pumping.  [facility] brochure given with review of basic breastfeeding teaching.   Patient Name: Wanda Coffey EUMPN'T Date: 10/08/2018 Reason for consult: Initial assessment   Maternal Data    Feeding Feeding Type: Breast Milk  LATCH Score Latch: Grasps breast easily, tongue down, lips flanged, rhythmical sucking.  Audible Swallowing: Spontaneous and intermittent  Type of Nipple: Everted at rest and after stimulation  Comfort (Breast/Nipple): Soft / non-tender  Hold (Positioning): Assistance needed to correctly position infant at breast and maintain latch.  LATCH Score: 9  Interventions Interventions: Assisted with latch;Skin to skin;Breast massage;Hand express;Breast compression;Adjust position;Support pillows;Position options;Expressed milk;DEBP  Lactation Tools Discussed/Used Pump Review: Setup, frequency, and cleaning Initiated by:: Staff Nurse(staff nurse to initiate ) Date initiated:: 10/08/18   Consult Status Consult Status: Follow-up Date: 10/08/18 Follow-up type: In-patient    Jess Barters [facility] 10/08/2018, 3:24 PM

## 2018-10-08 NOTE — Progress Notes (Addendum)
Patient ID: Wanda Coffey, female   DOB: Jun 02, 1986, 33 y.o.   MRN: [id number] Called to PACU about 800am for increased VB noted after c-section  Pt stable with normal VS and making good urine but she did have significant clot in her LUS.which was noted to be a bit atonic.  Code hemorrhage called due to blood loss calculation and second IV obtained.  Unfortunately the stat CBC clotted and there was delay in getting that until 900am due to a difficult blood draw and IV sites in both arms.  She received MTX, cytotec 800mg  rectally and a dose of traxenemic acid with response of uterus improved overall though has still had a smaller amount of clot noted on rechecks.   Her vital signs remained stable and she was given an extra liter of LR as well as 254mL of albumin.  Hgb stable on 9am check at 9.2 although d/w pt this is likely closer to 8 once equilabrates.  She is very stable but have d/w pt and husband if bleeding continues we will place a Bakri balloon.  Plan next check at 1000am.

## 2018-10-08 NOTE — Lactation Note (Signed)
This note was copied from a baby's chart. Lactation Consultation Note  Patient Name: Wanda Coffey Date: 10/08/2018 Reason for consult: Follow-up assessment;Multiple gestation;Infant < 6lbs  Mom called LC for assistance; she wanted to make sure babies were getting a proper latch. LC came to the room as babies were being brought from the nursery. LC started working with baby A first, had to change her diaper (void and stool) prior attempting to latch. LC took baby "A" STS to mother's right breast and she was able to latch after a few tries, but she would not suck for more than a few seconds, just a few suckles at a time; no audible swallows noted. Discussed normal newborn behavior with mom, especially for LPI babies. Afterwards LC finger fed baby "A" about 1 ml of colostrum, showed parents how to do finger feeding. Noticed that baby had a pacifier in her crib, explained mom how pacifiers could hurt breastfeeding when it's not fully established.  Following baby A, Dad brought Baby "B" and LC took her to mother's left breast; she also took a few more attempts to latch, but baby B able to achieve latch for at least 5 minutes, on and off. Baby would keep falling asleep but mom decided to keep her STS to the breast due to low temperatures. Both babies had a deep wide areolar latch, showed mom the key points for a deep latch while baby was still suckling, but just as her sibling, no audible swallows noted. LC had to stop in between do to some hand expression and get babies to take some drops of colostrum.   Mom hasn't started pumping yet (or supplementing). LC noticed that junctures in her pump were loose and adjusted them. Mom understands that if there is not enough breast milk available formula will be use in order to complete volumes required for supplementation. The formula of choice will be Similac 22 calorie formula. Discussed nutritive Vs. non-nutritive sucking.  Feeding plan:  1.  Encouraged mom to feed babies STS every 3 hours or sooner if feeding cues are present 2. Mom will pump every 3 hours after feeding and will feed babies any amount of EBM she will get. 3. Parents will discontinue pacifiers  Parents reported all questions and concerns were answered, they're both aware of [place] services and will call PRN.  Maternal Data    Feeding Feeding Type: Breast Fed  LATCH Score Latch: Repeated attempts needed to sustain latch, nipple held in mouth throughout feeding, stimulation needed to elicit sucking reflex.  Audible Swallowing: None  Type of Nipple: Everted at rest and after stimulation  Comfort (Breast/Nipple): Soft / non-tender  Hold (Positioning): Assistance needed to correctly position infant at breast and maintain latch.  LATCH Score: 6  Interventions Interventions: Breast feeding basics reviewed;Assisted with latch;Skin to skin;Breast massage;Hand express;Breast compression;DEBP;Adjust position;Support pillows  Lactation Tools Discussed/Used Tools: Pump Breast pump type: Double-Electric Breast Pump Pump Review: Setup, frequency, and cleaning Initiated by:: Staff Nurse(staff nurse to assist ) Date initiated:: 10/08/18   Consult Status Consult Status: Follow-up Date: 10/09/18 Follow-up type: In-patient    [place] 10/08/2018, 6:59 PM

## 2018-10-09 LAB — CBC
HCT: 21.6 % — ABNORMAL LOW (ref 36.0–46.0)
Hemoglobin: 6.6 g/dL — CL (ref 12.0–15.0)
MCH: 24.2 pg — ABNORMAL LOW (ref 26.0–34.0)
MCHC: 30.6 g/dL (ref 30.0–36.0)
MCV: 79.1 fL — ABNORMAL LOW (ref 80.0–100.0)
NRBC: 0 % (ref 0.0–0.2)
Platelets: 109 10*3/uL — ABNORMAL LOW (ref 150–400)
RBC: 2.73 MIL/uL — ABNORMAL LOW (ref 3.87–5.11)
RDW: 20.3 % — ABNORMAL HIGH (ref 11.5–15.5)
WBC: 18.5 10*3/uL — AB (ref 4.0–10.5)

## 2018-10-09 MED ORDER — SODIUM CHLORIDE 0.9% IV SOLUTION: Freq: Once | INTRAVENOUS | Status: DC

## 2018-10-09 NOTE — Lactation Note (Signed)
This note was copied from a baby's chart. Lactation Consultation Note:  Twins are 9 hours old.  Mother reports that she has pumped with the Symphony DEBP at least 4 times and she has not been able to  pump any colostrum.   Mother reports that her husband went home to get her Wilmar and her hands free bra.  Mother also reports that she and husband spoon fed infants after breastfeeding last evening.  Parents didn't keep a chart of feedings and spoon feeding was not recorded in chart.   Discussion on use of Neosure to supplement infant along with ebm.  Mother reports that Peds and she discussed this am and she would like to wait until next weight check and see if infants gain weight.    " Baby A" assist to the breast but showing no cuing signs.  Advised to spoon feed infant first to stimulate infants appetite, and then breastfeed.  Infant was given 12 ml of ebm and the latched on for 5 mins with a few sucks.   "Baby B'" was not showing cuing signs .  Infant was fed 15 ml of ebm with a spoon.  Infant then latched on with a few sucks and swallows for 5-10 mins.   Discussed the use of formula again and reviewed LPI guidelines again  Highlighting the needed amts for supplement every 2-3 hours.  Discussed infants needs for additional calories.  Mother was given a #5 fr feeding tube and syringe.  Discussed how to use at breast and with finger feeding.  Mother liked perferred feeding tube due to possible nipple confusing with a bottle nipple.   Discussed S/S of dehydration with Mother.  Mother reports that she may call for formula with the next feeding.   Advised mother to page for formula and page for staff nurse to assist with finger feeding  Or feeding tube at the breast.    Patient Name: Wanda Coffey [id number] Date: 10/09/2018 Reason for consult: Follow-up assessment;Late-preterm 34-36.6wks   Maternal Data    Feeding Feeding Type: Breast Milk  LATCH Score Latch:  Repeated attempts needed to sustain latch, nipple held in mouth throughout feeding, stimulation needed to elicit sucking reflex.  Audible Swallowing: A few with stimulation  Type of Nipple: Everted at rest and after stimulation  Comfort (Breast/Nipple): Soft / non-tender  Hold (Positioning): Assistance needed to correctly position infant at breast and maintain latch.  LATCH Score: 7  Interventions Interventions: Assisted with latch;Skin to skin;Hand express;Breast compression;Adjust position;Support pillows;Position options;Expressed milk;DEBP  Lactation Tools Discussed/Used     Consult Status Consult Status: Follow-up Date: 10/10/18 Follow-up type: In-patient    Jess Barters [facility] 10/09/2018, 3:47 PM

## 2018-10-09 NOTE — Anesthesia Postprocedure Evaluation (Signed)
Anesthesia Post Note  Patient: Wanda Coffey  Procedure(s) Performed: CESAREAN SECTION MULTI-GESTATIONAL (N/A Abdomen)     Patient location during evaluation: Mother Baby Anesthesia Type: Spinal Level of consciousness: oriented and awake and alert Pain management: pain level controlled Vital Signs Assessment: post-procedure vital signs reviewed and stable Respiratory status: spontaneous breathing, respiratory function stable and patient connected to nasal cannula oxygen Cardiovascular status: blood pressure returned to baseline and stable Postop Assessment: no headache, no backache, no apparent nausea or vomiting, patient able to bend at knees and able to ambulate Anesthetic complications: no    Last Vitals:  Vitals:   10/09/18 0315 10/09/18 0700  BP: 108/67 108/61  Pulse: 75 76  Resp: 18 18  Temp: 37.3 C 37.1 C  SpO2:      Last Pain:  Vitals:   10/09/18 0700  TempSrc: Oral  PainSc: 4    Pain Goal: Patients Stated Pain Goal: 3 (10/08/18 1430)                 Rayvon Char

## 2018-10-09 NOTE — Addendum Note (Signed)
Addendum  created 10/09/18 0829 by Rayvon Char, CRNA   Clinical Note Signed

## 2018-10-09 NOTE — Progress Notes (Signed)
Subjective: Postpartum Day 1: Cesarean Delivery/postpartum hemorrhage Patient reports incisional pain and tolerating PO.  She ambulated short distances last PM and did ok, but is very fatigued. Breastfeeding going ok. Normal   Objective: Vital signs in last 24 hours: Temp:  [98.2 F (36.8 C)-99.4 F (37.4 C)] 98.8 F (37.1 C) (02/02 0700) Pulse Rate:  [68-85] 76 (02/02 0700) Resp:  [16-28] 18 (02/02 0700) BP: (107-121)/(61-75) 108/61 (02/02 0700) SpO2:  [93 %-100 %] 99 % (02/01 1851) Weight:  [90 kg] 90 kg (02/01 1227)  Physical Exam:  General: alert and cooperative Lochia: appropriate Uterine Fundus: firm deviates to right and umb +1-unchanged and likely from the fibroids Incision: C/D/I   Recent Labs    10/08/18 1127 10/09/18 0649  HGB 9.0* 6.6*  HCT 29.6* 21.6*    Assessment/Plan: Status post Cesarean section.  Hgb this AM 6.6.  Patient tolerating reasonably well, but is noting increased fatigue and has not ambulated much.  We discussed the +/- of transfusion including risk of infections and transfusion reactions.  She would like to proceed. Babies in room and doing well.  Logan Bores 10/09/2018, 8:45 AM

## 2018-10-10 LAB — CBC
HCT: 27.1 % — ABNORMAL LOW (ref 36.0–46.0)
HCT: 28.7 % — ABNORMAL LOW (ref 36.0–46.0)
Hemoglobin: 8.4 g/dL — ABNORMAL LOW (ref 12.0–15.0)
Hemoglobin: 8.8 g/dL — ABNORMAL LOW (ref 12.0–15.0)
MCH: 25.3 pg — ABNORMAL LOW (ref 26.0–34.0)
MCH: 25.3 pg — ABNORMAL LOW (ref 26.0–34.0)
MCHC: 31 g/dL (ref 30.0–36.0)
MCHC: 30.7 g/dL (ref 30.0–36.0)
MCV: 81.6 fL (ref 80.0–100.0)
MCV: 82.5 fL (ref 80.0–100.0)
PLATELETS: 107 10*3/uL — AB (ref 150–400)
Platelets: 104 10*3/uL — ABNORMAL LOW (ref 150–400)
RBC: 3.32 MIL/uL — ABNORMAL LOW (ref 3.87–5.11)
RBC: 3.48 MIL/uL — ABNORMAL LOW (ref 3.87–5.11)
RDW: 20.2 % — ABNORMAL HIGH (ref 11.5–15.5)
RDW: 20.5 % — AB (ref 11.5–15.5)
WBC: 14.6 10*3/uL — AB (ref 4.0–10.5)
WBC: 13.6 10*3/uL — ABNORMAL HIGH (ref 4.0–10.5)
nRBC: 0.6 % — ABNORMAL HIGH (ref 0.0–0.2)
nRBC: 0.4 % — ABNORMAL HIGH (ref 0.0–0.2)

## 2018-10-10 LAB — BPAM RBC
BLOOD PRODUCT EXPIRATION DATE: 202002152359
Blood Product Expiration Date: 202002122359
Blood Product Expiration Date: 202003062359
ISSUE DATE / TIME: 202002020942
ISSUE DATE / TIME: 202002021203
Unit Type and Rh: 5100
Unit Type and Rh: 5100
Unit Type and Rh: 9500

## 2018-10-10 LAB — TYPE AND SCREEN
ABO/RH(D): O POS
Antibody Screen: NEGATIVE
UNIT DIVISION: 0
Unit division: 0
Unit division: 0

## 2018-10-10 NOTE — Lactation Note (Signed)
This note was copied from a baby's chart. Lactation Consultation Note  Patient Name: Wanda Coffey [id number] Date: 10/10/2018 Reason for consult: Follow-up assessment   Twins 61 hours old.  [redacted]w[redacted]d.  < 6 lbs.   Mother latched Baby B in football hold on the L side. Intermittent sucks and swallows observed for more than 10 min.  Baby doing well. Mother recently pumped 20 ml per side and knows to increase supplemental per day of life and as baby desires. Reviewed LPI volume guidelines.  Praised mother for her efforts.  Mother has personal DEBP. Mother will breastfeed second after first and will try to tandem feed soon.  Maternal Data    Feeding Feeding Type: Breast Fed  LATCH Score                   Interventions    Lactation Tools Discussed/Used     Consult Status Consult Status: Follow-up Date: 10/11/18 Follow-up type: In-patient    Vivianne Master [facility] 10/10/2018, 2:35 PM

## 2018-10-10 NOTE — Progress Notes (Signed)
Patient ID: Wanda Coffey, female   DOB: 11/16/1985, 33 y.o.   MRN: [id number] Pt reports back pain especially with ambulation. Lochia mild. Doing well otherwise. Abdominal binder helping VSS  13.6>8.8<104  A/P: POD#1 - stable         Heating pad to back prn          Consider flexeril if needed         Routine pp/post op care

## 2018-10-10 NOTE — Progress Notes (Signed)
Patient ID: Wanda Coffey, female   DOB: 1985/12/08, 33 y.o.   MRN: [id number] Pt is doing well. Reports decreased fatigue after transfusion. She is bonding well with both girls. She is breastfeeding. She denies fever, chills, SOP or CP. Ambulating well; occasional cramps only. Voiding well.  VSS ABD - dressing in place EXT - no homans  14.6>8.4<107  A/P: POD#2 s/p primary c/s mono/di twins with pp hemorrhage &  transfusion-stable         Repeat CBC at noon an dif stable remove IV         Routine pp care         Abdominal binder

## 2018-10-11 ENCOUNTER — Encounter (HOSPITAL_COMMUNITY): Payer: Self-pay

## 2018-10-11 MED ORDER — CYCLOBENZAPRINE HCL 10 MG PO TABS
10.0000 mg | ORAL_TABLET | Freq: Once | ORAL | Status: AC
  Administered 2018-10-11: 10 mg via ORAL
  Filled 2018-10-11: qty 1

## 2018-10-11 MED ORDER — IBUPROFEN 800 MG PO TABS: 800.0000 mg | ORAL_TABLET | Freq: Three times a day (TID) | ORAL | 0 refills | Status: DC

## 2018-10-11 MED ORDER — OXYCODONE HCL 5 MG PO TABS: 5.0000 mg | ORAL_TABLET | Freq: Four times a day (QID) | ORAL | 0 refills | Status: DC | PRN

## 2018-10-11 NOTE — Lactation Note (Signed)
This note was copied from a baby's chart. Lactation Consultation Note:  Twins are 45 hours old.  Mother is breastfeeding and then supplementing with ebm.  Baby A breastfed for 10 mins and then took 32 ml of ebm.  Baby B breastfed 13 mins and then took 25 ml of ebm.   Mother reports that her nipples are not sore.  She does have comfort gels as needed.  Mother also has a harmony hand pump .  Mother will continue to post pump for 15-20 mins.  At least 6 or more times daily.  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 Discussed treatment and prevention of engorgement.   Advised mother to follow up with [facility] services for a pre and post weight assessment.   Mother reports that she will phone [facility] office when needed.   Encouraged to continue to cue base feed and at least 8-12 times or more in 24 hours,. Advised mother to rest as much as possible. Discouraged multi-tasking.  Mother has good support system.  Reviewed available resources at [facility] and in the community.   Patient Name: Wanda Coffey [id number] Date: 10/11/2018 Reason for consult: Follow-up assessment   Maternal Data    Feeding Feeding Type: Breast Fed  LATCH Score                   Interventions Interventions: Comfort gels;Hand pump;DEBP  Lactation Tools Discussed/Used     Consult Status Consult Status: Complete Date: (Mother will call [facility] office for consult)    Darla Lesches 10/11/2018, 10:26 AM

## 2018-10-11 NOTE — Discharge Instructions (Signed)
As per discharge pamphlet °

## 2018-10-11 NOTE — Progress Notes (Signed)
POD #3 Doing well, wants to go home, babies stable in room-have gained weight Afeb, VSS Abd- soft, incision intact, dressing coming off, fundus firm Continue routine care, d/c home if babies able to go

## 2018-10-11 NOTE — Plan of Care (Signed)
Patient appropriate for discharge.

## 2018-10-11 NOTE — Discharge Summary (Signed)
OB Discharge Summary     Patient Name: Wanda Coffey DOB: 04-30-1986 MRN: [id number]  Date of admission: 10/08/2018 Delivering MD:    Pilar, Westergaard [[id number]]  Jerra, Huckeby [[id number]]  Paula Compton   Date of discharge: 10/11/2018  Admitting diagnosis: 59 wks twins water broke and having ctx Intrauterine pregnancy: [redacted]w[redacted]d     Secondary diagnosis:  Active Problems:   S/P primary low transverse C-section      Discharge diagnosis: Preterm Pregnancy Delivered and monochorionic, diamniotic twins, postpartum hemorrhage                                                                                            Post partum procedures:blood transfusion  Hospital course:  Onset of Labor With Unplanned C/S  33 y.o. yo G2P1001 at [redacted]w[redacted]d was admitted in [place] with SROM on 10/08/2018. Membrane Rupture Time/Date:    [name], [name] [[id number]]  3:30 AM   [name], [name] [[id number]]  3:30 AM ,   [name], [name] [[id number]]  10/08/2018   [name], [name] [[id number]]  10/08/2018   The patient went for cesarean section due to [facility], and delivered a Viable infant,   [name], [name] Girl Aneesha [[id number]]  10/08/2018   [name], [name] [[id number]]  10/08/2018  Details of operation can be found in separate operative note. Patient had a postpartum course complicated by hemorrhage in PACU treated with methergine, cytotec and tranexemic acid.  She ended up with symptomatic anemia with Hgb 6.6, received 2 units PRBC with improvement.  She is ambulating,tolerating a regular diet, passing flatus, and urinating well.  Patient is discharged home in stable condition 10/11/18.  Physical exam  Vitals:   10/10/18 0555 10/10/18 1522 10/10/18 2219 10/11/18 0545  BP: 110/71 120/73 119/75 116/75  Pulse: 66 82 78 63  Resp: 16 16  16   Temp: 97.9 F (36.6 C) 98.3 F (36.8 C) 98.2 F (36.8 C) 97.8 F (36.6 C)  TempSrc: Oral Oral Oral Oral   SpO2:   99% 100%  Weight:      Height:       General: alert Lochia: appropriate Uterine Fundus: firm Incision: Healing well with no significant drainage  Labs: Lab Results  Component Value Date   WBC 13.6 (H) 10/10/2018   HGB 8.8 (L) 10/10/2018   HCT 28.7 (L) 10/10/2018   MCV 82.5 10/10/2018   PLT 104 (L) 10/10/2018   No flowsheet data found.  Discharge instruction: per After Visit Summary and "Baby and Me Booklet".  After visit meds:  Allergies as of 10/11/2018   No Known Allergies     Medication List    TAKE these medications   ferrous sulfate 325 (65 FE) MG tablet Take 325 mg by mouth 2 (two) times daily.   ibuprofen 800 MG tablet Commonly known as:  ADVIL,MOTRIN Take 1 tablet (800 mg total) by mouth every 8 (eight) hours.   oxyCODONE 5 MG immediate release tablet Commonly known as:  Oxy IR/ROXICODONE Take 1 tablet (5 mg total) by mouth every 6 (six) hours as needed for severe pain.   prenatal  multivitamin Tabs tablet Take 1 tablet by mouth daily at 12 noon.   PROBIOTIC PO Take 1 capsule by mouth daily.       Diet: routine diet  Activity: Advance as tolerated. Pelvic rest for 6 weeks.   Outpatient follow up:2 weeks  Newborn Data:   Wanda, Coffey [[id number]]  Live born female  Birth Weight: 5 lb 4.7 oz (2400 g) APGAR: 8, 9  Newborn Delivery   Birth date/time:  10/08/2018 06:21:00 Delivery type:  C-Section, Low Transverse Trial of labor:  No C-section categorization:  Primary      Wanda, Coffey [[id number]]  Live born female  Birth Weight: 5 lb 7.5 oz (2480 g) APGAR: 7, 9  Newborn Delivery   Birth date/time:  10/08/2018 06:24:00 Delivery type:  C-Section, Low Transverse Trial of labor:  No C-section categorization:  Primary     Baby Feeding: Breast Disposition:home with mother   10/11/2018 Clarene Duke, MD

## 2018-10-14 ENCOUNTER — Encounter (HOSPITAL_COMMUNITY)
Admission: RE | Admit: 2018-10-14 | Discharge: 2018-10-14 | Disposition: A | Discharge status: Home / Self Care | Payer: 59 | Source: Ambulatory Visit

## 2018-10-14 HISTORY — DX: Benign neoplasm of connective and other soft tissue, unspecified: D21.9

## 2018-10-15 ENCOUNTER — Inpatient Hospital Stay (HOSPITAL_COMMUNITY): Admit: 2018-10-15 | Payer: 59 | Admitting: Obstetrics and Gynecology

## 2019-10-11 IMAGING — US US MFM OB FOLLOW-UP
1 series · 13 of 28 positions shown · non-contrast
Comparison: none

[Series 1: us mfm ob follow-up · 99 acquisitions, 13 frames shown]
[im 4/99]
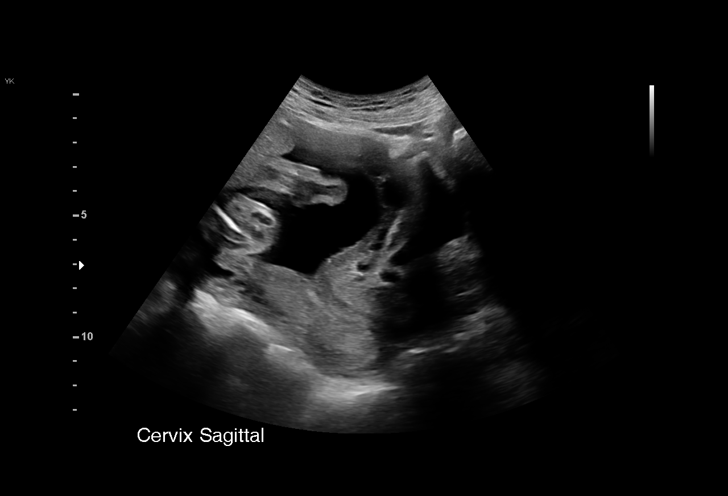
[im 11/99]
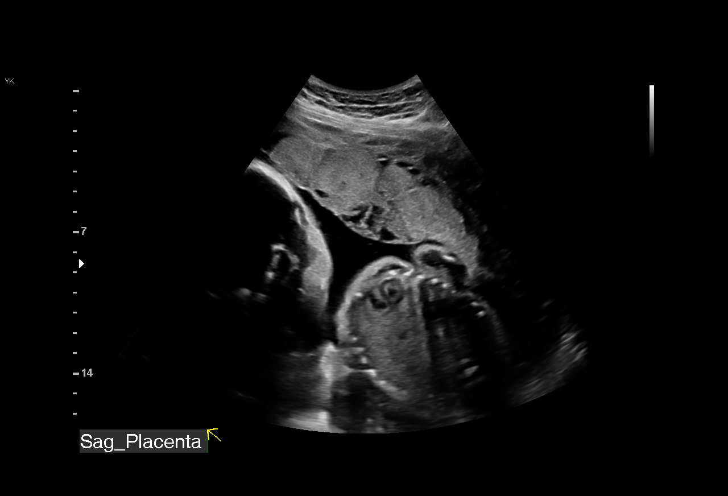
[im 19/99]
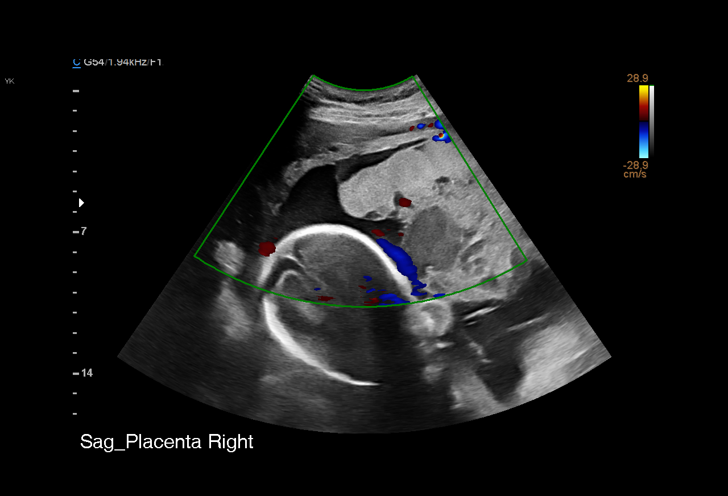
[im 26/99]
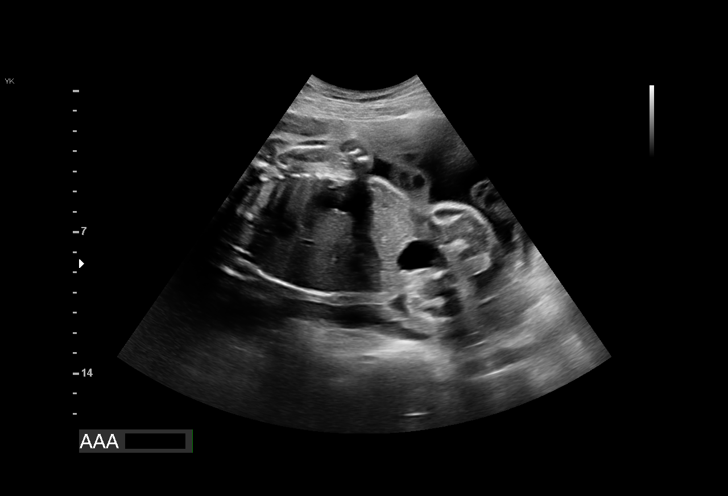
[im 33/99]
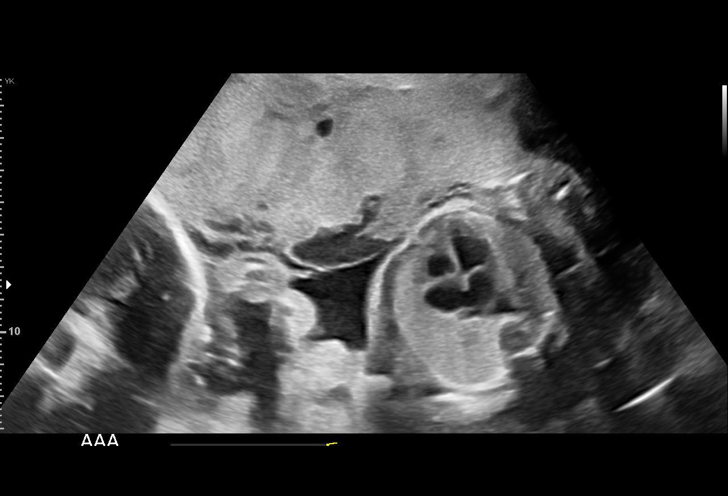
[im 40/99]
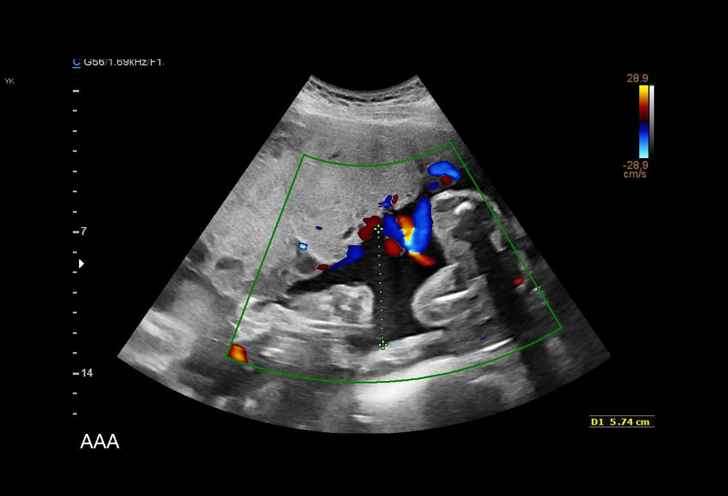
[im 51/99]
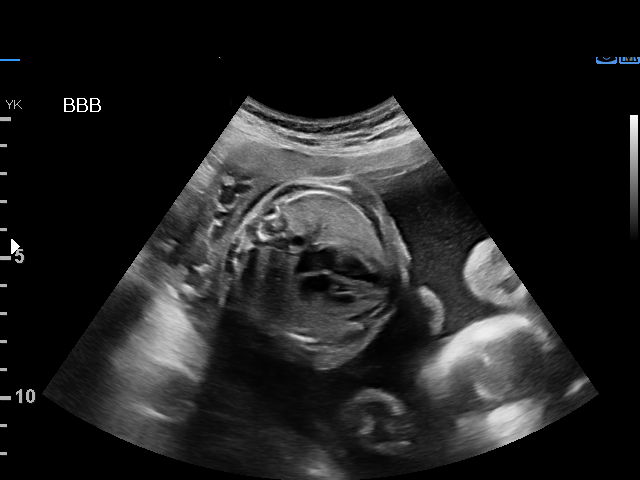
[im 59/99]
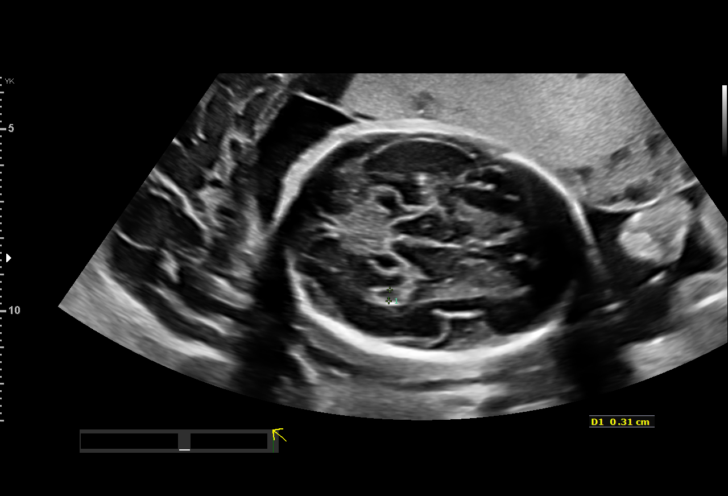
[im 66/99]
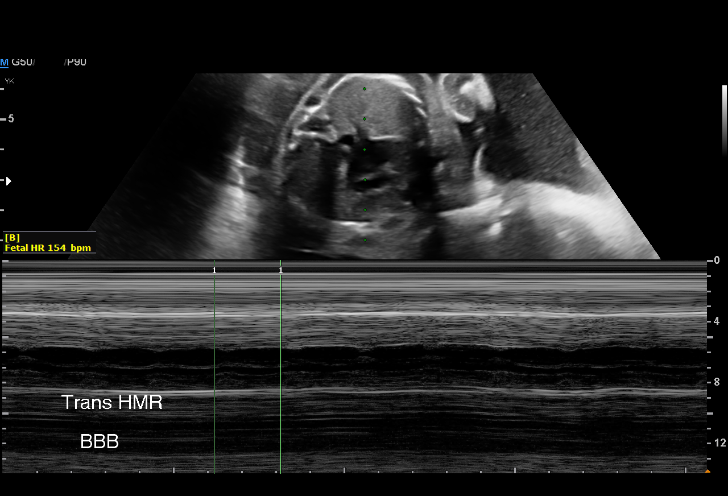
[im 73/99]
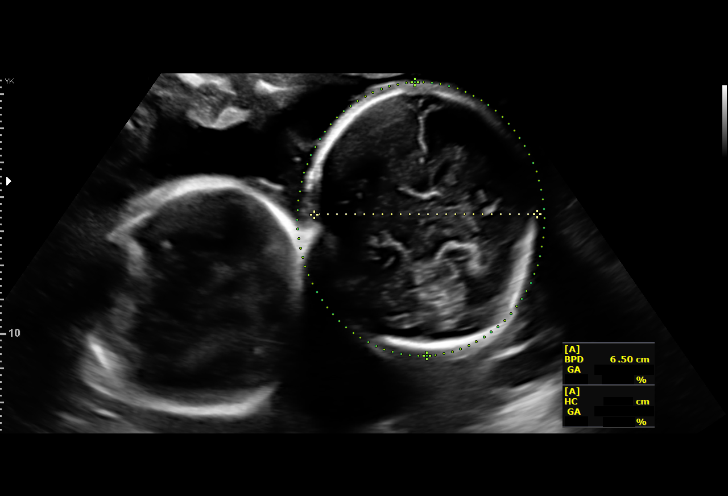
[im 80/99]
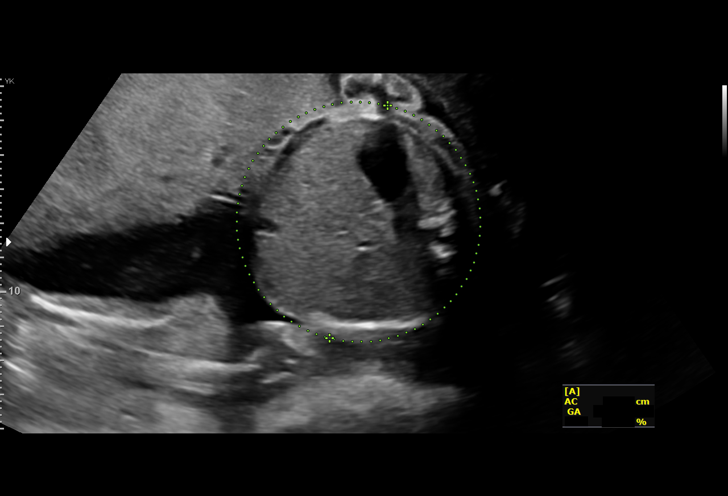
[im 88/99]
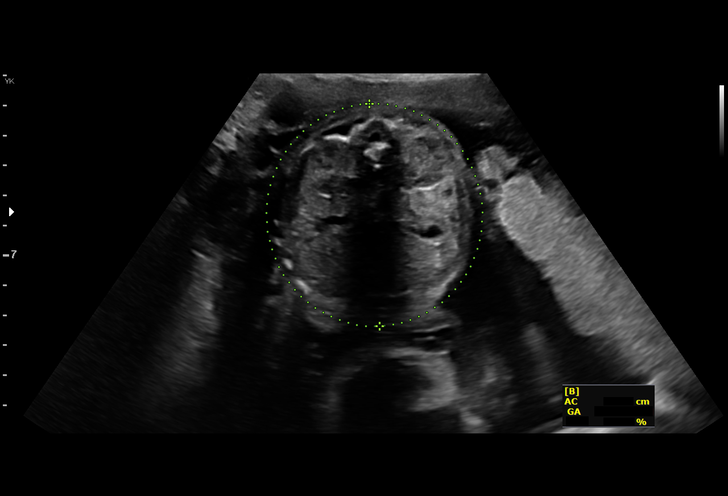
[im 95/99]
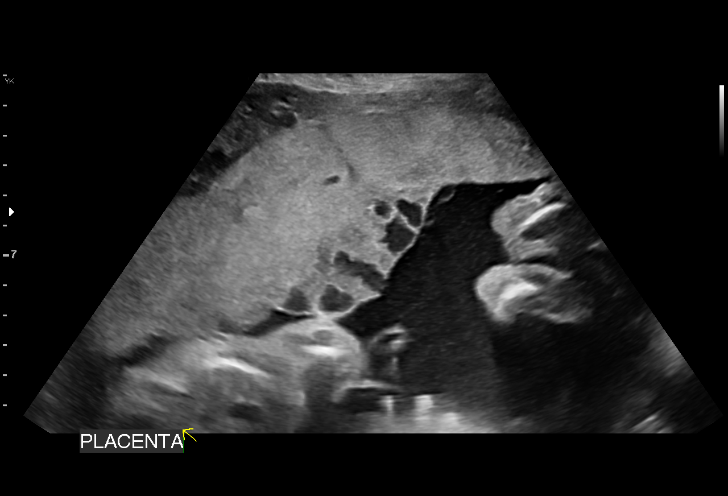

[13 of 28 positions shown; findings below may reference images not displayed]

#101

     GEST
 ----------------------------------------------------------------------

 ----------------------------------------------------------------------
Indications

  Twin pregnancy, Luciani/Nanglegan, second trimester
  Anemia during pregnancy in second trimester
  Uterine fibroids affecting pregnancy in        O34.12,
  second trimester, [id number]
  26 weeks gestation of pregnancy
 ----------------------------------------------------------------------
Vital Signs

                                                Height:        5'1"
Fetal Evaluation (Fetus A)

 Num Of Fetuses:         2
 Fetal Heart Rate(bpm):  155
 Cardiac Activity:       Observed
 Fetal Lie:              Lower Fetus
 Presentation:           Breech
 Placenta:               Anterior

 Amniotic Fluid
 AFI FV:      Within normal limits

                             Largest Pocket(cm)

Biometry (Fetus A)

 BPD:      63.2  mm     G. Age:  25w 4d         18  %    CI:        71.09   %    70 - 86
                                                         FL/HC:      20.4   %    18.6 -
 HC:      238.8  mm     G. Age:  26w 0d         17  %    HC/AC:      1.11        1.04 -
 AC:      215.9  mm     G. Age:  26w 1d         35  %    FL/BPD:     77.1   %    71 - 87
 FL:       48.7  mm     G. Age:  26w 3d         39  %    FL/AC:      22.6   %    20 - 24
 Est. FW:     897  gm           2 lb     51  %     FW Discordancy         3  %
OB History

 Gravidity:    2         Term:   1        Prem:   0        SAB:   0
 TOP:          0       Ectopic:  0        Living: 1
Gestational Age (Fetus A)

 LMP:           27w 2d        Date:  01/19/18                 EDD:   10/26/18
 U/S Today:     26w 0d                                        EDD:   11/04/18
 Best:          26w 2d     Det. By:  Early Ultrasound         EDD:   11/02/18
                                     (03/16/18)
Anatomy (Fetus A)

 Ventricles:            Appears normal         Kidneys:                Appear normal
 Heart:                 Appears normal         Bladder:                Appears normal
                        (4CH, axis, and situs
 Stomach:               Appears normal, left
                        sided

Fetal Evaluation (Fetus B)

 Num Of Fetuses:         2
 Fetal Heart Rate(bpm):  154
 Cardiac Activity:       Observed
 Fetal Lie:              Upper Fetus
 Presentation:           Transverse, head to maternal right
 Placenta:               Anterior

 Amniotic Fluid
 AFI FV:      Within normal limits

                             Largest Pocket(cm)


 Comment:    Small subchorionic hemorrhage on the right edge.
Biometry (Fetus B)

 BPD:      62.8  mm     G. Age:  25w 3d         15  %    CI:        66.71   %    70 - 86
                                                         FL/HC:      19.6   %    18.6 -
 HC:      246.4  mm     G. Age:  26w 5d         41  %    HC/AC:      1.12        1.04 -
 AC:      220.7  mm     G. Age:  26w 3d         49  %    FL/BPD:     76.9   %    71 - 87
 FL:       48.3  mm     G. Age:  26w 2d         34  %    FL/AC:      21.9   %    20 - 24

 Est. FW:     926  gm      2 lb 1 oz     55  %     FW Discordancy      0 \ 3 %
Gestational Age (Fetus B)

 LMP:           27w 2d        Date:  01/19/18                 EDD:   10/26/18
 U/S Today:     26w 2d                                        EDD:   11/02/18
 Best:          26w 2d     Det. By:  Early Ultrasound         EDD:   11/02/18
                                     (03/16/18)
Anatomy (Fetus B)

 Ventricles:            Appears normal         Kidneys:                Appear normal
 Heart:                 Appears normal         Bladder:                Appears normal
                        (4CH, axis, and situs
 Stomach:               Appears normal, left
                        sided
Cervix Uterus Adnexa

 Cervix
 Length:           3.88  cm.
 Normal appearance by transabdominal scan.
Impression

 Monochorionic-diamniotic twin pregnancy.

 Twin A: Lower fetus, breech presentation, anterior placenta.
 Amniotic fluid is normal and good fetal activity is seen. Fetal
 growth is appropriate for gestational age. Fetal bladder is
 seen.

 Twin B: Upper fetus, transverse and head to maternal right,
 anterior placenta. Amniotic fluid is normal and good fetal
 activity is seen. Fetal growth is appropriate for gestational
 age. Fetal bladder is seen.

 Growth discordancy: 3% (normal).

 No evidence of twin-to-twin transfusion syndrome.

 We reassured the couple of the findings.

 Monochorionic twins have a higher likelihood of having
 congenital malformations. I recommended fetal
 echocardiography.
Recommendations

 -Follow-up scan in 2 weeks.
 -We will set up an appointment for fetal echocardiography.
                 Bosque, Kenji

## 2019-10-13 ENCOUNTER — Other Ambulatory Visit: Payer: Self-pay

## 2019-10-13 ENCOUNTER — Other Ambulatory Visit: Payer: Self-pay | Admitting: Physician Assistant

## 2019-10-13 ENCOUNTER — Ambulatory Visit
Admission: RE | Admit: 2019-10-13 | Discharge: 2019-10-13 | Disposition: A | Discharge status: Home / Self Care | Payer: 59 | Source: Ambulatory Visit | Attending: Physician Assistant | Admitting: Physician Assistant

## 2019-10-13 DIAGNOSIS — R05 Cough: Secondary | ICD-10-CM

## 2019-10-13 DIAGNOSIS — R059 Cough, unspecified: Secondary | ICD-10-CM

## 2019-11-22 IMAGING — US US MFM FETAL BPP W/O NON-STRESS
1 series · 14 of 28 positions shown · non-contrast
Comparison: none

[Series 1: us mfm fetal bpp w/o non-stress · 14 of 29 slices shown]
[im 2/29]
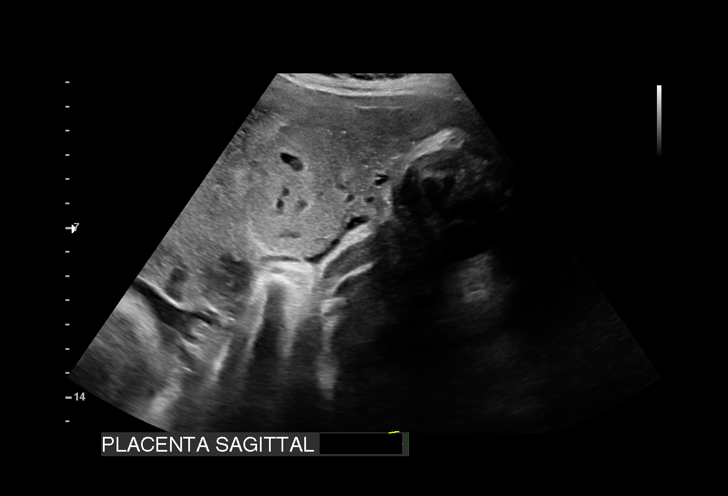
[im 4/29]
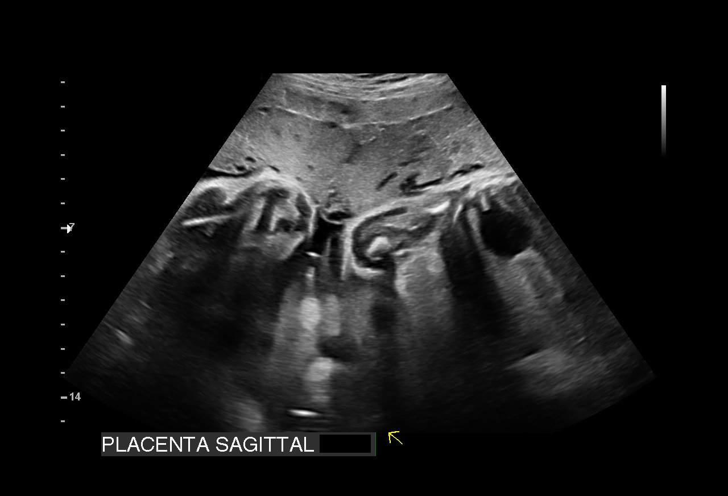
[im 6/29]
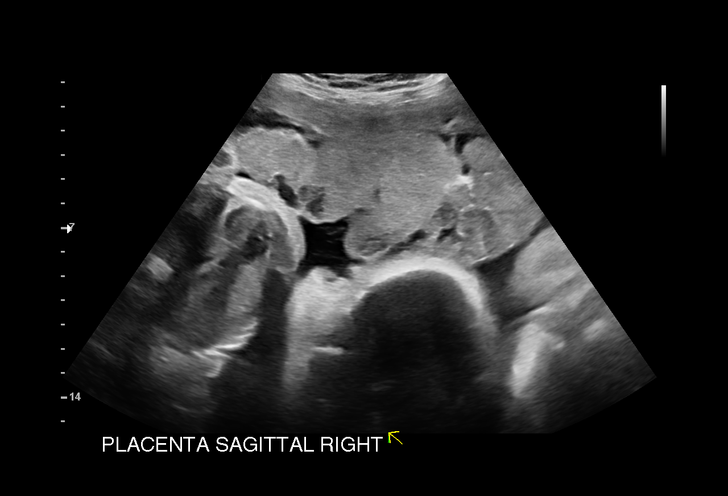
[im 8/29]
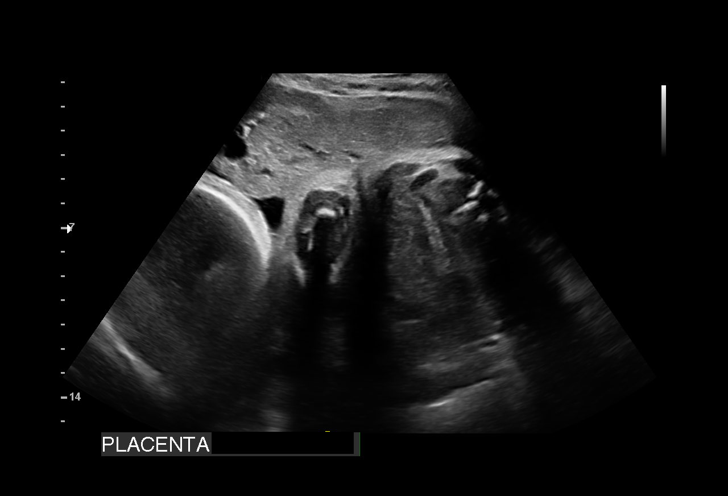
[im 10/29]
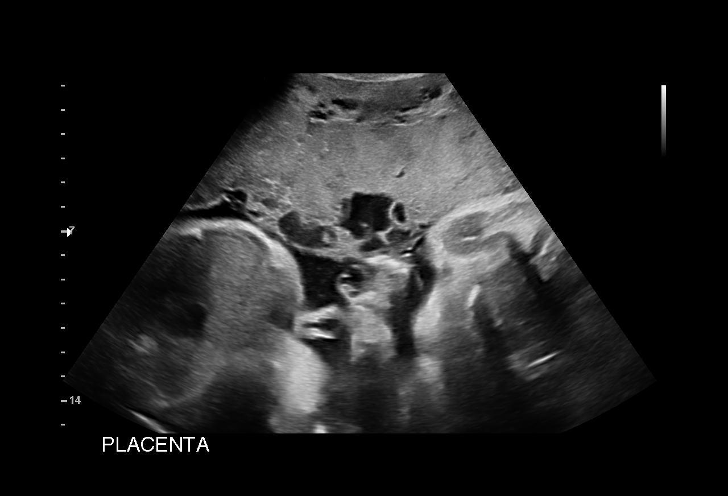
[im 12/29]
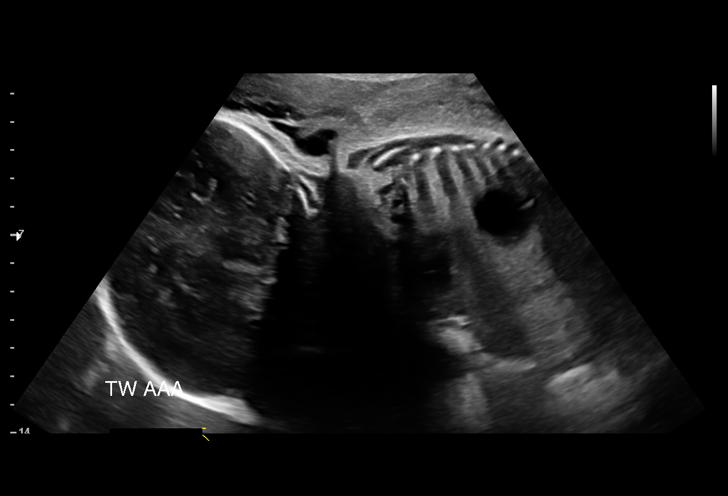
[im 14/29]
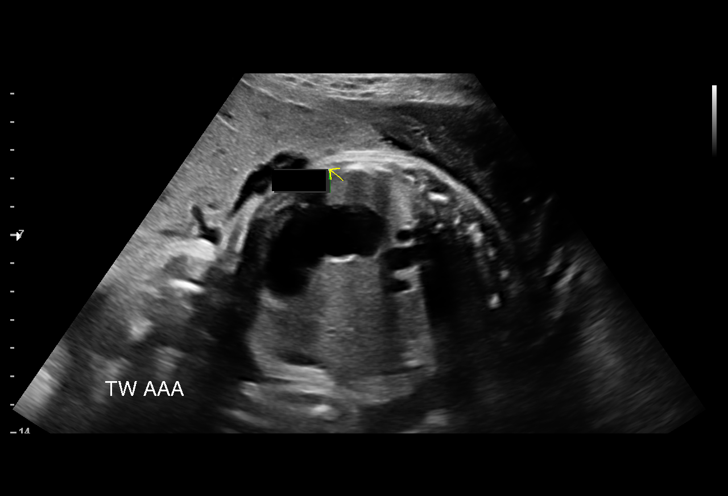
[im 16/29]
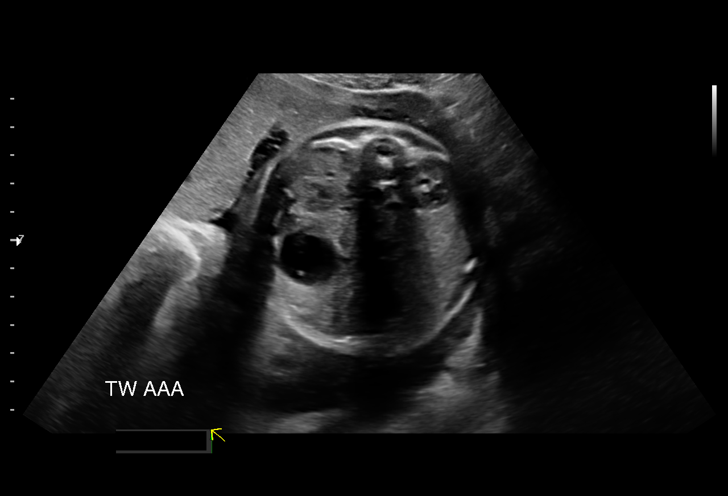
[im 18/29]
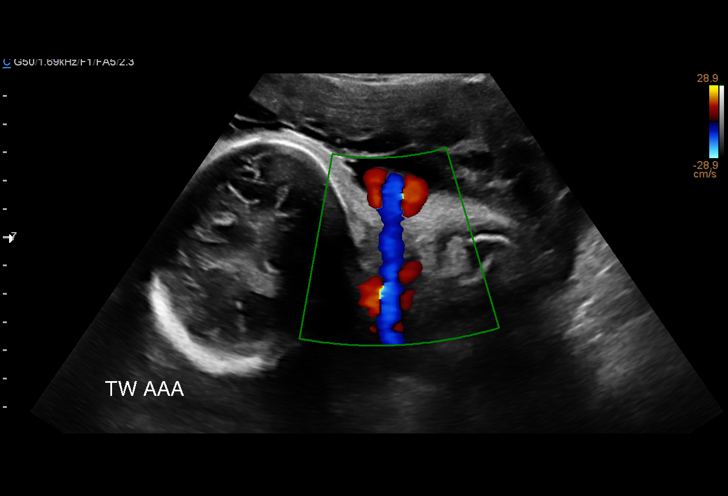
[im 20/29]
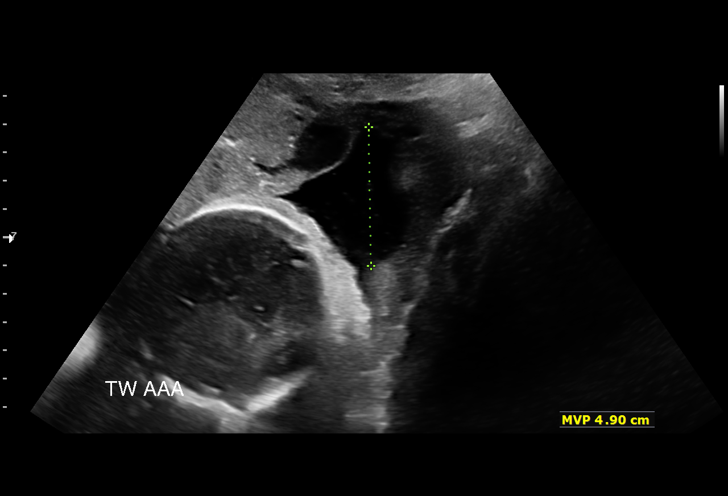
[im 22/29]
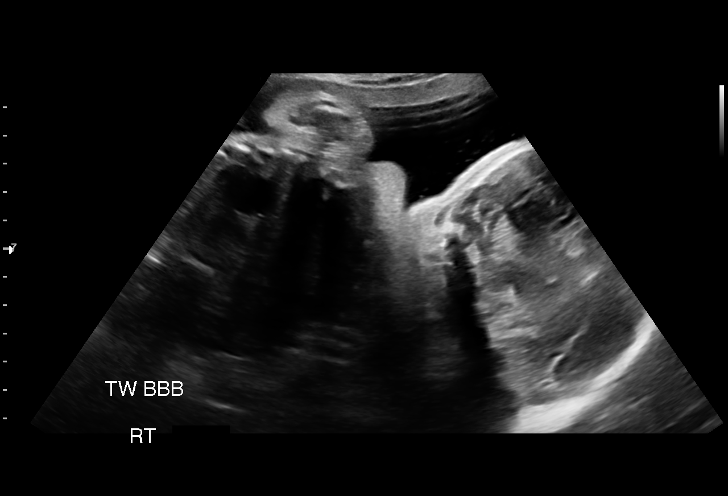
[im 24/29]
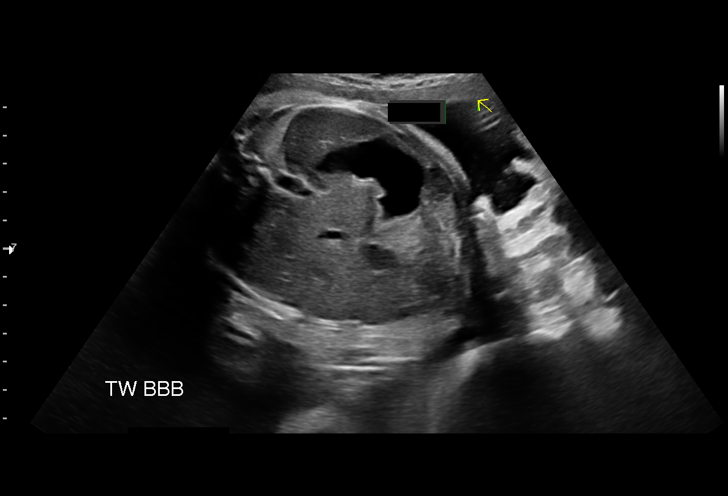
[im 26/29]
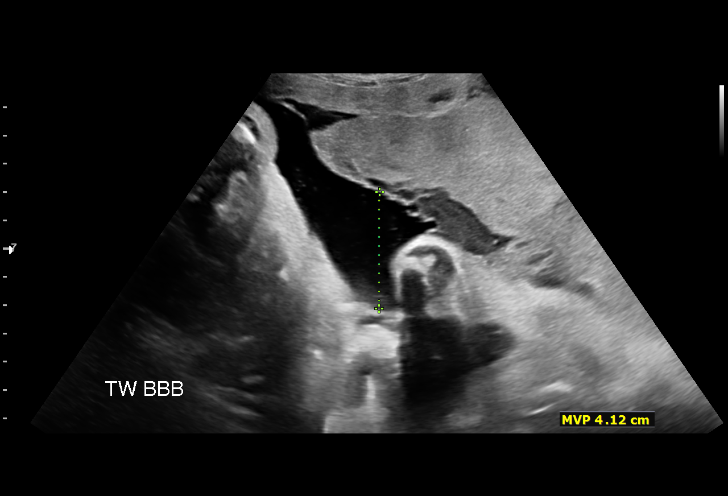
[im 29/29]
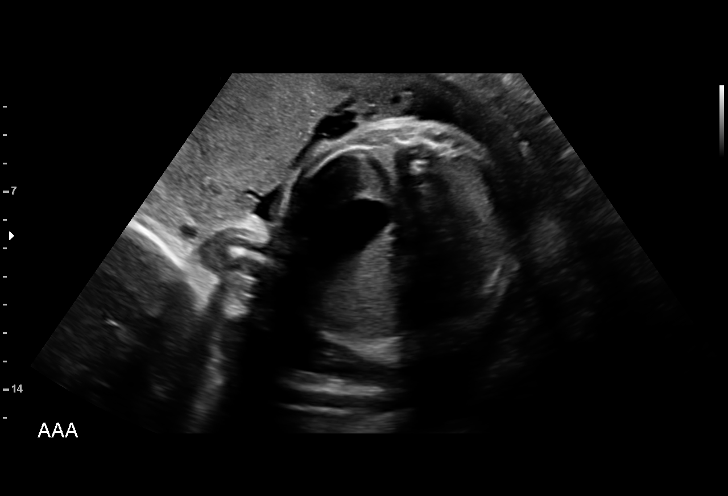

[14 of 28 positions shown; findings below may reference images not displayed]

#101

     ADDL GESTATION                                    GETAHUN
 ----------------------------------------------------------------------

 ----------------------------------------------------------------------
Indications

  Uterine fibroids affecting pregnancy in         O34.12,
  second trimester, [id number]
  32 weeks gestation of pregnancy
  Anemia during pregnancy in third trimester
  Encounter for other antenatal screening
  follow-up
  Twin pregnancy, [place], third trimester
 ----------------------------------------------------------------------
Vital Signs

                                                Height:        5'1"
Fetal Evaluation (Fetus A)

 Num Of Fetuses:          2
 Fetal Heart              146
 Rate(bpm):
 Cardiac Activity:        Observed
 Fetal Lie:               Maternal left side
 Presentation:            Breech
 Placenta:                Anterior

 Amniotic Fluid
 AFI FV:      Within normal limits

                             Largest Pocket(cm)

Biophysical Evaluation (Fetus A)

 Amniotic F.V:   Within normal limits       F. Tone:         Observed
 F. Movement:    Observed                   Score:           [DATE]
 F. Breathing:   Observed
OB History

 Gravidity:    2         Term:   1        Prem:   0         SAB:   0
 TOP:          0       Ectopic:  0        Living: 1
Gestational Age (Fetus A)

 LMP:           33w 2d        Date:  01/19/18                 EDD:    10/26/18
 Best:          32w 2d     Det. By:  Early Ultrasound         EDD:    11/02/18
                                     (03/16/18)
Anatomy (Fetus A)

 Stomach:               Appears normal,        Bladder:                Appears normal
                        left sided
 Kidneys:               Appear normal

Fetal Evaluation (Fetus B)

 Num Of Fetuses:          2
 Fetal Heart              161
 Rate(bpm):
 Cardiac Activity:        Observed
 Fetal Lie:               Maternal right side
 Presentation:            Cephalic
 Placenta:                Anterior

 Amniotic Fluid
 AFI FV:      Within normal limits

                             Largest Pocket(cm)

Biophysical Evaluation (Fetus B)

 Amniotic F.V:   Within normal limits       F. Tone:         Observed
 F. Movement:    Observed                   Score:           [DATE]
 F. Breathing:   Observed
Gestational Age (Fetus B)

 LMP:           33w 2d        Date:  01/19/18                 EDD:    10/26/18
 Best:          32w 2d     Det. By:  Early Ultrasound         EDD:    11/02/18
                                     (03/16/18)
Anatomy (Fetus B)

 Stomach:               Appears normal,        Bladder:                Appears normal
                        left sided
 Kidneys:               Appear normal
Cervix Uterus Adnexa

 Cervix
 Not visualized (advanced GA >26wks)
Myomas

  Site                     L(cm)      W(cm)       D(cm)      Location

 ----------------------------------------------------------------------

  Blood Flow                 RI        PI       Comments
                                                Not reassessed on this
                                                exam.
 ----------------------------------------------------------------------
Impression

 Biophysical profile [DATE] x 2
Recommendations

 Continue weekly BPP
 Follow up growth in 3 week.

## 2019-12-06 IMAGING — US USMFM FETAL BPP W/O NON-STRESS ADDL GEST
1 series · 12 of 28 positions shown · non-contrast
Comparison: none

[Series 1: usmfm fetal bpp w/o non-stress addl gest · 56 acquisitions, 12 frames shown]
[im 3/56]
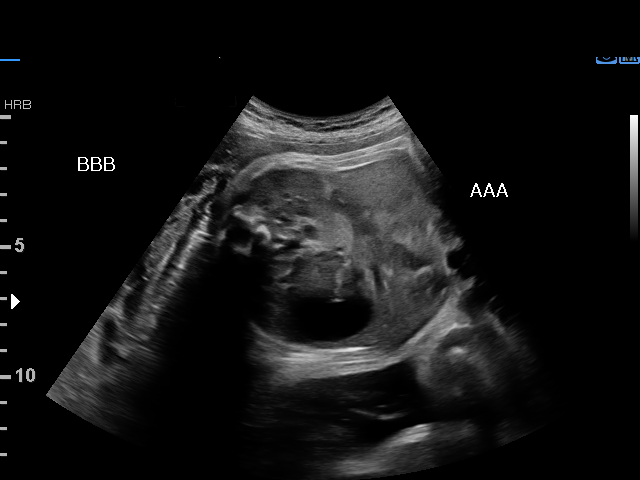
[im 7/56]
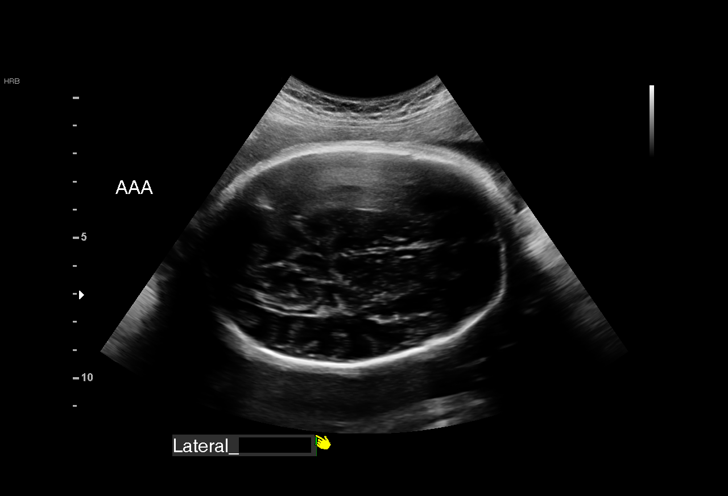
[im 11/56]
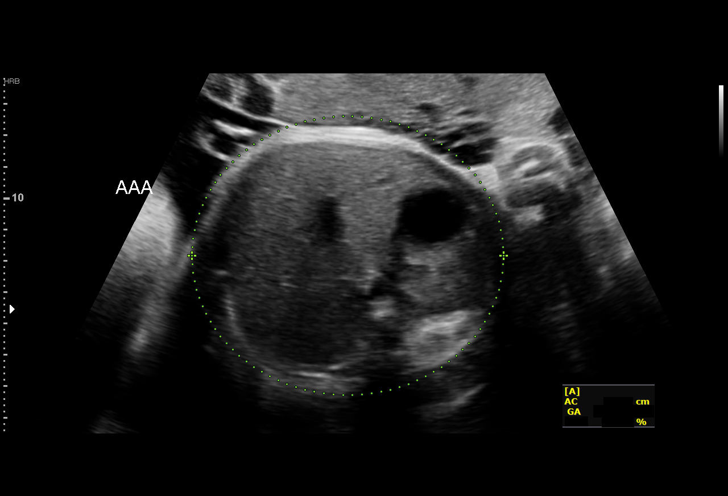
[im 17/56]
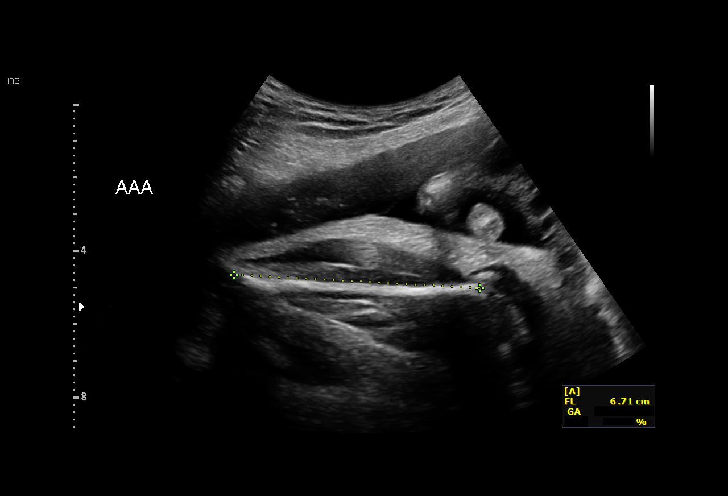
[im 21/56]
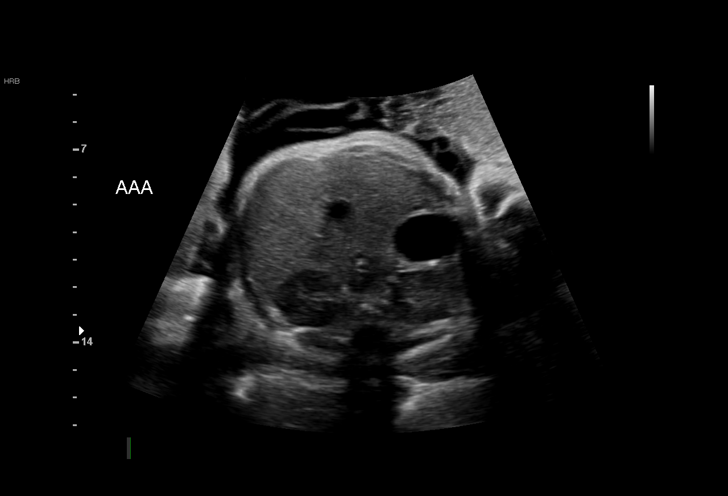
[im 25/56]
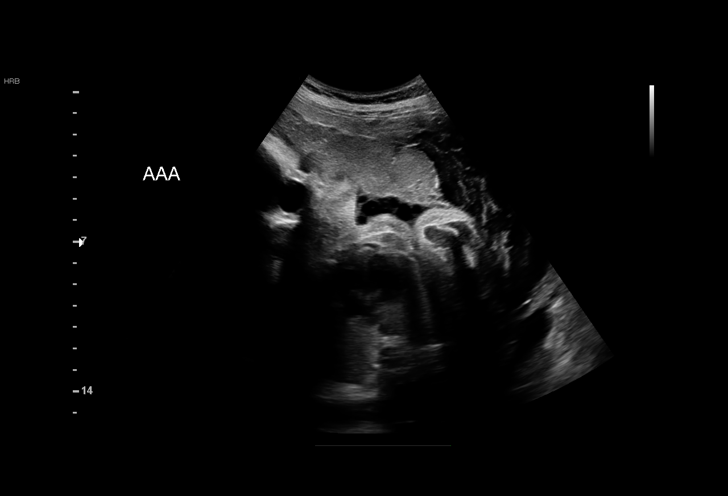
[im 31/56]
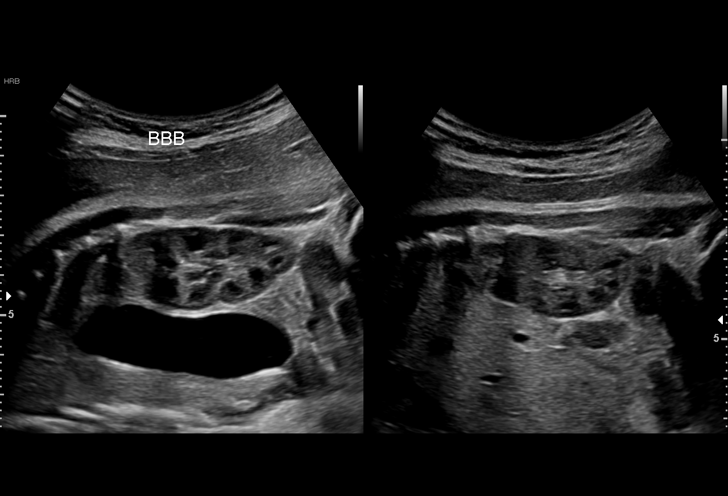
[im 35/56]
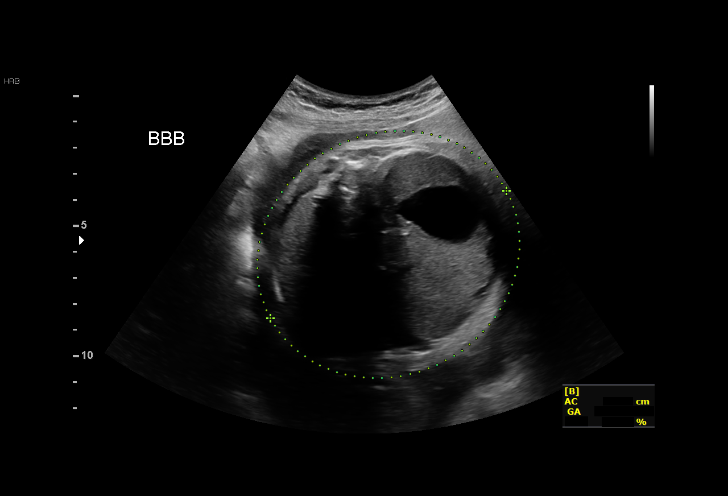
[im 39/56]
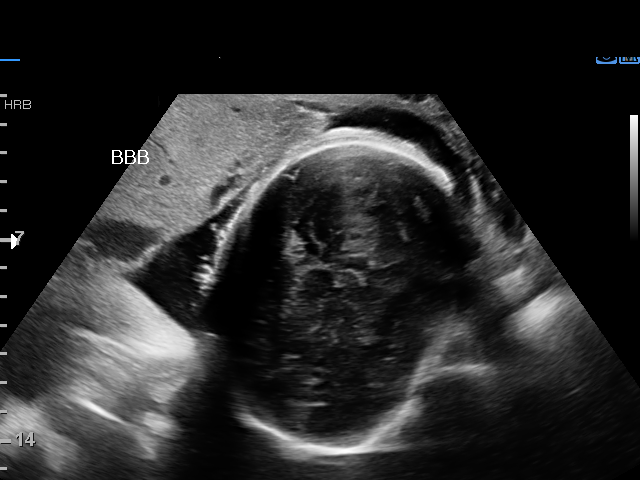
[im 45/56]
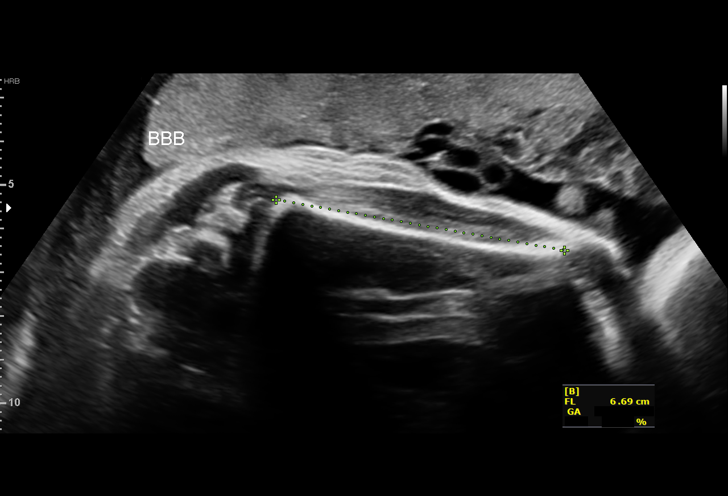
[im 49/56]
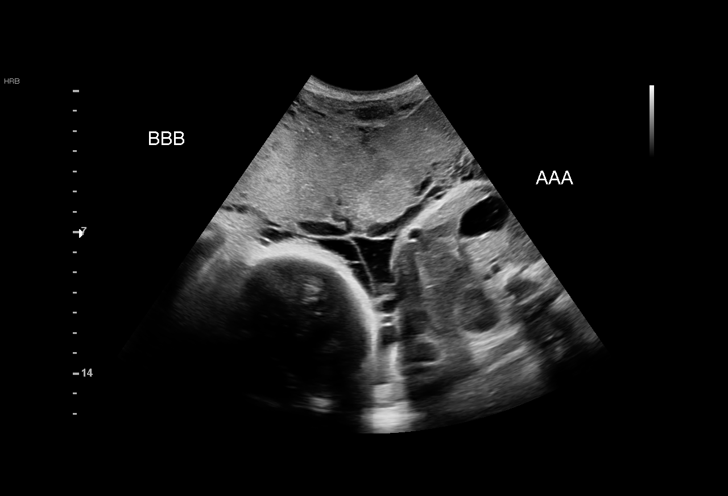
[im 53/56]
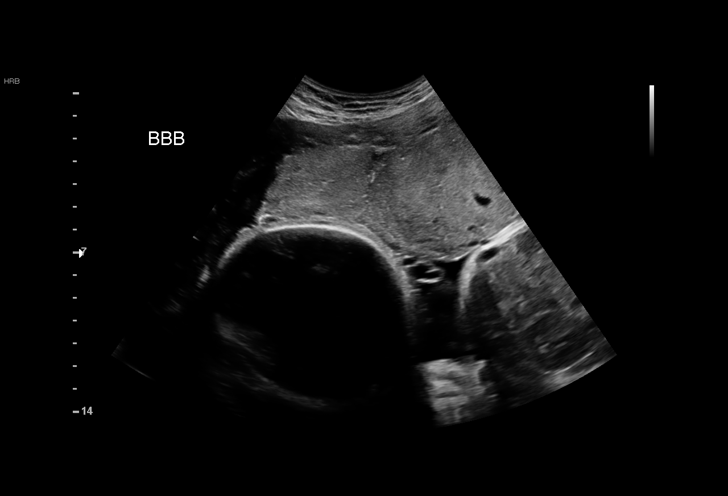

[12 of 28 positions shown; findings below may reference images not displayed]

#101

     EMME                                              REZARTO
     ADDL GESTATION
 ----------------------------------------------------------------------

 ----------------------------------------------------------------------
Indications

  Anemia during pregnancy in third trimester
  Twin pregnancy, [place], third trimester
  Uterine fibroids affecting pregnancy in first   O34.11,
  trimester, antepartum
  34 weeks gestation of pregnancy
 ----------------------------------------------------------------------
Vital Signs

 (lb):
 BMI:
Fetal Evaluation (Fetus A)

 Num Of Fetuses:          2
 Fetal Heart              155
 Rate(bpm):
 Cardiac Activity:        Observed
 Fetal Lie:               Maternal left side
 Presentation:            Breech
 Placenta:                Anterior
 P. Cord Insertion:       Previously Visualized
 Amniotic Fluid
 AFI FV:      Within normal limits

                             Largest Pocket(cm)

Biophysical Evaluation (Fetus A)

 Amniotic F.V:   Within normal limits       F. Tone:         Observed
 F. Movement:    Observed                   Score:           [DATE]
 F. Breathing:   Observed
Biometry (Fetus A)

 BPD:      79.3  mm     G. Age:  31w 6d          2  %    CI:         68.63  %    70 - 86
                                                         FL/HC:       21.6  %    19.4 -
 HC:      305.9  mm     G. Age:  34w 1d         14  %    HC/AC:       1.03       0.96 -
 AC:      297.7  mm     G. Age:  33w 5d         39  %    FL/BPD:      83.4  %    71 - 87
 FL:       66.1  mm     G. Age:  34w 0d         34  %    FL/AC:       22.2  %    20 - 24
 HUM:      58.2  mm     G. Age:  33w 5d         49  %

 LV:        1.8  mm

 Est. FW:    6623   g    4 lb 15 oz      48  %     FW Discordancy          8  %
                    m
OB History

 Gravidity:    2         Term:   1        Prem:   0         SAB:   0
 TOP:          0       Ectopic:  0        Living: 1
Gestational Age (Fetus A)

 LMP:           35w 2d        Date:  01/19/18                 EDD:    10/26/18
 U/S Today:     33w 3d                                        EDD:    11/08/18
 Best:          34w 2d     Det. By:  Early Ultrasound         EDD:    11/02/18
                                     (03/16/18)
Anatomy (Fetus A)

 Cranium:               Appears normal         Aortic Arch:            Previously seen
 Cavum:                 Previously seen        Ductal Arch:            Previously seen
 Ventricles:            Appears normal         Diaphragm:              Previously seen
 Choroid Plexus:        Previously seen        Stomach:                Appears normal,
                                                                       left sided
 Cerebellum:            Previously seen        Abdomen:                Appears normal
 Posterior Fossa:       Previously seen        Abdominal Wall:         Previously seen
 Nuchal Fold:           Previously seen        Cord Vessels:           Previously seen
 Face:                  Orbits and profile     Kidneys:                Appear normal
                        previously seen
 Lips:                  Previously seen        Bladder:                Appears normal
 Thoracic:              Appears normal         Spine:                  Not well visualized
 Heart:                 Previously seen        Upper Extremities:      Previously seen
 RVOT:                  Previously seen        Lower Extremities:      Previously seen
 LVOT:                  Previously seen

 Other:  Female gender Left Heel and 5th digit visualized. Technically difficult
         due to fetal position.
Fetal Evaluation (Fetus B)

 Num Of Fetuses:          2
 Fetal Heart              151
 Rate(bpm):
 Cardiac Activity:        Observed
 Fetal Lie:               Maternal right side
 Presentation:            Transverse, head to maternal right
 Placenta:                Anterior
 P. Cord Insertion:       Previously Visualized

 Amniotic Fluid
 AFI FV:      Within normal limits

                             Largest Pocket(cm)

Biophysical Evaluation (Fetus B)

 Amniotic F.V:   Within normal limits       F. Tone:         Observed
 F. Movement:    Observed                   Score:           [DATE]
 F. Breathing:   Observed
Biometry (Fetus B)

 BPD:      83.4  mm     G. Age:  33w 4d         28  %    CI:         73.21  %    70 - 86
                                                         FL/HC:       21.5  %    19.4 -
 HC:      309.8  mm     G. Age:  34w 4d         23  %    HC/AC:       1.00       0.96 -
 AC:      309.1  mm     G. Age:  34w 6d         71  %    FL/BPD:      79.7  %    71 - 87
 FL:       66.5  mm     G. Age:  34w 2d         39  %    FL/AC:       21.5  %    20 - 24
 HUM:      56.5  mm     G. Age:  32w 6d         30  %

 Est. FW:    5327   g      5 lb 6 oz     65  %     FW Discordancy      0 \ 8  %
                    m
Gestational Age (Fetus B)

 LMP:           35w 2d        Date:  01/19/18                 EDD:    10/26/18
 U/S Today:     34w 2d                                        EDD:    11/02/18
 Best:          34w 2d     Det. By:  Early Ultrasound         EDD:    11/02/18
                                     (03/16/18)
Anatomy (Fetus B)

 Cranium:               Appears normal         Aortic Arch:            Previously seen
 Cavum:                 Previously seen        Ductal Arch:            Previously seen
 Ventricles:            Previously seen        Diaphragm:              Previously seen
 Choroid Plexus:        Previously seen        Stomach:                Appears normal,
                                                                       left sided
 Cerebellum:            Previously seen        Abdomen:                Previously seen
 Posterior Fossa:       Previously seen        Abdominal Wall:         Previously seen
 Nuchal Fold:           Previously seen        Cord Vessels:           Previously seen
 Face:                  Orbits and profile     Kidneys:                Appear normal
                        previously seen
 Lips:                  Previously seen        Bladder:                Appears normal
 Thoracic:              Appears normal         Spine:                  Previously seen
 Heart:                 Previously seen        Upper Extremities:      Previously seen
 RVOT:                  Previously seen        Lower Extremities:      Previously seen
 LVOT:                  Previously seen
 Other:  Female gender Heels visualized. Technically difficult due to fetal
         position.
Cervix Uterus Adnexa

 Cervix
 Not visualized (advanced GA >27wks)
Impression

 Monochorionic diamniotic twin gestation
Recommendations

 Scheduled for weekly BPP
 Plan for delivery between 36-38 weeks.

## 2020-03-26 ENCOUNTER — Encounter (HOSPITAL_COMMUNITY): Payer: 59

## 2020-03-27 ENCOUNTER — Other Ambulatory Visit (HOSPITAL_COMMUNITY): Payer: Self-pay | Admitting: *Deleted

## 2020-03-28 ENCOUNTER — Ambulatory Visit (HOSPITAL_COMMUNITY)
Admission: RE | Admit: 2020-03-28 | Discharge: 2020-03-28 | Disposition: A | Discharge status: Home / Self Care | Payer: 59 | Source: Ambulatory Visit | Attending: Physician Assistant | Admitting: Physician Assistant

## 2020-03-28 DIAGNOSIS — D509 Iron deficiency anemia, unspecified: Secondary | ICD-10-CM | POA: Insufficient documentation

## 2020-03-28 MED ORDER — SODIUM CHLORIDE 0.9 % IV SOLN
510.0000 mg | INTRAVENOUS | Status: DC
  Administered 2020-03-28: 510 mg via INTRAVENOUS
  Filled 2020-03-28: qty 17

## 2020-04-04 ENCOUNTER — Ambulatory Visit (HOSPITAL_COMMUNITY)
Admission: RE | Admit: 2020-04-04 | Discharge: 2020-04-04 | Disposition: A | Discharge status: Home / Self Care | Payer: 59 | Source: Ambulatory Visit | Attending: Physician Assistant | Admitting: Physician Assistant

## 2020-04-04 ENCOUNTER — Other Ambulatory Visit: Payer: Self-pay

## 2020-04-04 DIAGNOSIS — D509 Iron deficiency anemia, unspecified: Secondary | ICD-10-CM | POA: Diagnosis not present

## 2020-04-04 MED ORDER — SODIUM CHLORIDE 0.9 % IV SOLN
510.0000 mg | INTRAVENOUS | Status: AC
  Administered 2020-04-04: 510 mg via INTRAVENOUS
  Filled 2020-04-04: qty 17

## 2020-04-10 ENCOUNTER — Other Ambulatory Visit: Payer: Self-pay | Admitting: Family

## 2020-04-10 DIAGNOSIS — D649 Anemia, unspecified: Secondary | ICD-10-CM

## 2020-04-11 ENCOUNTER — Other Ambulatory Visit: Payer: Self-pay

## 2020-04-11 ENCOUNTER — Encounter: Payer: Self-pay | Admitting: Family

## 2020-04-11 ENCOUNTER — Inpatient Hospital Stay: Payer: 59 | Attending: Family | Admitting: Family

## 2020-04-11 ENCOUNTER — Inpatient Hospital Stay: Payer: 59

## 2020-04-11 VITALS — BP 111/69 | HR 75 | Temp 98.2°F | Resp 18 | Ht 62.0 in | Wt 172.4 lb

## 2020-04-11 DIAGNOSIS — D649 Anemia, unspecified: Secondary | ICD-10-CM

## 2020-04-11 DIAGNOSIS — Z86018 Personal history of other benign neoplasm: Secondary | ICD-10-CM | POA: Insufficient documentation

## 2020-04-11 DIAGNOSIS — N920 Excessive and frequent menstruation with regular cycle: Secondary | ICD-10-CM | POA: Diagnosis not present

## 2020-04-11 DIAGNOSIS — D500 Iron deficiency anemia secondary to blood loss (chronic): Secondary | ICD-10-CM | POA: Diagnosis not present

## 2020-04-11 LAB — CBC WITH DIFFERENTIAL (CANCER CENTER ONLY)
Abs Immature Granulocytes: 0.04 10*3/uL (ref 0.00–0.07)
Basophils Absolute: 0 10*3/uL (ref 0.0–0.1)
Basophils Relative: 1 %
Eosinophils Absolute: 0.1 10*3/uL (ref 0.0–0.5)
Eosinophils Relative: 3 %
HCT: 36.9 % (ref 36.0–46.0)
Hemoglobin: 10.4 g/dL — ABNORMAL LOW (ref 12.0–15.0)
Immature Granulocytes: 1 %
Lymphocytes Relative: 33 %
Lymphs Abs: 1.5 10*3/uL (ref 0.7–4.0)
MCH: 20.5 pg — ABNORMAL LOW (ref 26.0–34.0)
MCHC: 28.2 g/dL — ABNORMAL LOW (ref 30.0–36.0)
MCV: 72.6 fL — ABNORMAL LOW (ref 80.0–100.0)
Monocytes Absolute: 0.4 10*3/uL (ref 0.1–1.0)
Monocytes Relative: 8 %
Neutro Abs: 2.4 10*3/uL (ref 1.7–7.7)
Neutrophils Relative %: 54 %
Platelet Count: 117 10*3/uL — ABNORMAL LOW (ref 150–400)
RBC: 5.08 MIL/uL (ref 3.87–5.11)
WBC Count: 4.5 10*3/uL (ref 4.0–10.5)
nRBC: 0 % (ref 0.0–0.2)

## 2020-04-11 LAB — CMP (CANCER CENTER ONLY)
ALT: 7 U/L (ref 0–44)
AST: 13 U/L — ABNORMAL LOW (ref 15–41)
Albumin: 4.5 g/dL (ref 3.5–5.0)
Alkaline Phosphatase: 58 U/L (ref 38–126)
Anion gap: 5 (ref 5–15)
BUN: 8 mg/dL (ref 6–20)
CO2: 27 mmol/L (ref 22–32)
Calcium: 9.5 mg/dL (ref 8.9–10.3)
Chloride: 107 mmol/L (ref 98–111)
Creatinine: 0.75 mg/dL (ref 0.44–1.00)
GFR, Est AFR Am: >60 mL/min (ref >60)
GFR, Estimated: >60 mL/min (ref >60)
Glucose, Bld: 101 mg/dL — ABNORMAL HIGH (ref 70–99)
Potassium: 3.9 mmol/L (ref 3.5–5.1)
Sodium: 139 mmol/L (ref 135–145)
Total Bilirubin: 0.9 mg/dL (ref 0.3–1.2)
Total Protein: 7 g/dL (ref 6.5–8.1)

## 2020-04-11 LAB — IRON AND TIBC
Iron: 66 ug/dL (ref 41–142)
Saturation Ratios: 18 % — ABNORMAL LOW (ref 21–57)
TIBC: 371 ug/dL (ref 236–444)
UIBC: 306 ug/dL (ref 120–384)

## 2020-04-11 LAB — RETICULOCYTES
Immature Retic Fract: 10 % (ref 2.3–15.9)
RBC.: 5.06 MIL/uL (ref 3.87–5.11)
Retic Count, Absolute: 99.2 10*3/uL (ref 19.0–186.0)
Retic Ct Pct: 2 % (ref 0.4–3.1)

## 2020-04-11 LAB — SAVE SMEAR(SSMR), FOR PROVIDER SLIDE REVIEW

## 2020-04-11 LAB — LACTATE DEHYDROGENASE: LDH: 194 U/L — ABNORMAL HIGH (ref 98–192)

## 2020-04-11 LAB — FERRITIN: Ferritin: 214 ng/mL (ref 11–307)

## 2020-04-11 NOTE — Progress Notes (Signed)
Hematology/Oncology Consultation   Name: Wanda Coffey      MRN: [id number]    Location: Room/bed info not found  Date: 04/11/2020 Time:9:24 AM   REFERRING PHYSICIAN: Kiera A. Leonides Sake, PA  REASON FOR CONSULT: Iron deficiency anemia    DIAGNOSIS: Iron deficiency anemia   HISTORY OF PRESENT ILLNESS: Wanda Coffey is a very pleasant 34 yo African American female with history of iron deficiency anemia secondary to heavy cycles.  She states that since having her twins 18 months ago her cycle has not been quite as heavy. She does have history of uterine fibroids.  She has a regular cycle lasting 4-5 days. She does note clots at times.  She has not noted any other blood loss. No bruising or petechiae.  She is symptomatic with fatigued, chills and ice cravings. She has also noted her hair thinning and brittle nails.  She had her thyroid checked by her PCP and states that this was normal.  She states that she first had IV iron several years ago after failing oral iron. She received 2 doses of Feraheme in July and developed a rash on her neck several days after each dose. Her PCP has prescribed her steroids. As such, we may need to premedicate her prior to future infusions.  Her mother and sisters also have iron deficiency anemia.  No sickle cell disease or trait. She said she thinks she was positive for alpha thalassemia in the past.  No personal history of cancer. Her father had colon cancer.  She states that she had her first colonoscopy in 2016 and had several benign polyps removed.  No fever, n/v, cough, rash, dizziness, SOB, chest pain, palpitations, abdominal pain or changes in bowel or bladder habits.  She has chronic constipation worsened by the oral iron.  No swelling, tenderness, numbness or tingling in her extremities.  No falls or syncope.  No smoking, ETOH or recreational drug use.  She has maintained a good appetite and is staying well hydrated, Her weight is stable.  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   ROS: All other 10 point review of systems is negative.   PAST MEDICAL HISTORY:   Past Medical History:  Diagnosis Date   Anemia    Fibroid     ALLERGIES: No Known Allergies    MEDICATIONS:  Current Outpatient Medications on File Prior to Visit  Medication Sig Dispense Refill   Prenatal Vit-Fe Fumarate-FA (PRENATAL MULTIVITAMIN) TABS tablet Take 1 tablet by mouth daily at 12 noon.     Probiotic Product (PROBIOTIC PO) Take 1 capsule by mouth daily.      Vitamin D, Ergocalciferol, (DRISDOL) 1.25 MG (50000 UNIT) CAPS capsule Take 50,000 Units by mouth once a week.     ferrous sulfate 325 (65 FE) MG tablet Take 325 mg by mouth 2 (two) times daily. (Patient not taking: Reported on 04/11/2020)  5   No current facility-administered medications on file prior to visit.     PAST SURGICAL HISTORY Past Surgical History:  Procedure Laterality Date   CESAREAN SECTION MULTI-GESTATIONAL N/A 10/08/2018   Procedure: CESAREAN SECTION MULTI-GESTATIONAL;  Surgeon: Sherlyn Hay, DO;  Location: [facility];  Service: Obstetrics;  Laterality: N/A;  Tracey RNFA   NO PAST SURGERIES     WISDOM TOOTH EXTRACTION      FAMILY HISTORY: Family History  Problem Relation Age of Onset   Cancer Father  Heart disease Father    Thyroid disease Sister     SOCIAL HISTORY:  reports that she has never smoked. She has never used smokeless tobacco. She reports that she does not drink alcohol and does not use drugs.  PERFORMANCE STATUS: The patient's performance status is 1 - Symptomatic but completely ambulatory  PHYSICAL EXAM: Most Recent Vital Signs: Blood pressure 111/69, pulse 75, temperature 98.2 F (36.8 C), temperature source Oral, resp. rate 18, height 5\' 2"  (1.575 m), weight 172 lb 6.4 oz (78.2 kg), SpO2 100 %, unknown if currently breastfeeding. BP 111/69 (BP Location: Left Arm,  Patient Position: Sitting)    Pulse 75    Temp 98.2 F (36.8 C) (Oral)    Resp 18    Ht 5\' 2"  (1.575 m)    Wt 172 lb 6.4 oz (78.2 kg)    SpO2 100%    BMI 31.53 kg/m   General Appearance:    Alert, cooperative, no distress, appears stated age  Head:    Normocephalic, without obvious abnormality, atraumatic  Eyes:    PERRL, conjunctiva/corneas clear, EOM's intact, fundi    benign, both eyes        Throat:   Lips, mucosa, and tongue normal; teeth and gums normal  Neck:   Supple, symmetrical, trachea midline, no adenopathy;    thyroid:  no enlargement/tenderness/nodules; no carotid   bruit or JVD  Back:     Symmetric, no curvature, ROM normal, no CVA tenderness  Lungs:     Clear to auscultation bilaterally, respirations unlabored  Chest Wall:    No tenderness or deformity   Heart:    Regular rate and rhythm, S1 and S2 normal, no murmur, rub   or gallop     Abdomen:     Soft, non-tender, bowel sounds active all four quadrants,    no masses, no organomegaly        Extremities:   Extremities normal, atraumatic, no cyanosis or edema  Pulses:   2+ and symmetric all extremities  Skin:   Skin color, texture, turgor normal, no rashes or lesions  Lymph nodes:   Cervical, supraclavicular, and axillary nodes normal  Neurologic:   CNII-XII intact, normal strength, sensation and reflexes    throughout    LABORATORY DATA:  Results for orders placed or performed in visit on 04/11/20 (from the past 48 hour(s))  CBC with Differential (Cancer Center Only)     Status: Abnormal   Collection Time: 04/11/20  8:48 AM  Result Value Ref Range   WBC Count 4.5 4.0 - 10.5 K/uL   RBC 5.08 3.87 - 5.11 MIL/uL   Hemoglobin 10.4 (L) 12.0 - 15.0 g/dL    Comment: Reticulocyte Hemoglobin testing may be clinically indicated, consider ordering this additional test [id number]    HCT 36.9 36 - 46 %   MCV 72.6 (L) 80.0 - 100.0 fL   MCH 20.5 (L) 26.0 - 34.0 pg   MCHC 28.2 (L) 30.0 - 36.0 g/dL   RDW Not Measured 11.5  - 15.5 %   Platelet Count 117 (L) 150 - 400 K/uL   nRBC 0.0 0.0 - 0.2 %   Neutrophils Relative % 54 %   Neutro Abs 2.4 1.7 - 7.7 K/uL   Lymphocytes Relative 33 %   Lymphs Abs 1.5 0.7 - 4.0 K/uL   Monocytes Relative 8 %   Monocytes Absolute 0.4 0 - 1 K/uL   Eosinophils Relative 3 %   Eosinophils Absolute 0.1 0 - 0 K/uL  Basophils Relative 1 %   Basophils Absolute 0.0 0 - 0 K/uL   Immature Granulocytes 1 %   Abs Immature Granulocytes 0.04 0.00 - 0.07 K/uL    Comment: Performed at [facility] at [facility], [address] [place], [place] [address]  Save Smear [facility])     Status: None   Collection Time: 04/11/20  8:48 AM  Result Value Ref Range   Smear Review SMEAR STAINED AND AVAILABLE FOR REVIEW     Comment: Performed at [facility] at [facility], [address], [place], [place] [address]  Reticulocytes     Status: None   Collection Time: 04/11/20  8:49 AM  Result Value Ref Range   Retic Ct Pct 2.0 0.4 - 3.1 %   RBC. 5.06 3.87 - 5.11 MIL/uL   Retic Count, Absolute 99.2 19.0 - 186.0 K/uL   Immature Retic Fract 10.0 2.3 - 15.9 %    Comment: Performed at [facility] at [facility], [address] [place], [place] [address]      RADIOGRAPHY: No results found.     PATHOLOGY: None  ASSESSMENT/PLAN: Wanda Coffey is a very pleasant 34 yo [place] American female with history of iron deficiency anemia secondary to heavy cycles.  We will see what her iron studies and alpha thalassemia look like and determine if she needs more IV iron and/or folic acid.  We will plan to see her again in 3 months.  All questions were answered and she is in agreement with the plan. She can contact our office with any questions or concerns. We can certainly see her sooner if needed.   Laverna Peace, NP

## 2020-04-12 ENCOUNTER — Telehealth: Payer: Self-pay | Admitting: Family

## 2020-04-12 NOTE — Telephone Encounter (Signed)
No los 8/5

## 2020-04-15 LAB — HGB FRACTIONATION CASCADE
Hgb A2: 2 % (ref 1.8–3.2)
Hgb A: 98 % (ref 96.4–98.8)
Hgb F: 0 % (ref 0.0–2.0)
Hgb S: 0 %

## 2020-04-19 LAB — ALPHA-THALASSEMIA GENOTYPR

## 2020-05-02 ENCOUNTER — Institutional Professional Consult (permissible substitution): Payer: 59 | Admitting: Plastic Surgery

## 2021-01-23 ENCOUNTER — Telehealth: Payer: Self-pay

## 2021-01-23 ENCOUNTER — Other Ambulatory Visit: Payer: Self-pay | Admitting: Family

## 2021-01-23 NOTE — Telephone Encounter (Signed)
Called pt to make an appt for 01/24/21 per sch message.  Pts vm is full. Called pts contact Victor/husband and he states that she is very busy tomprrow and req next week.  Appt made for 01/27/21 and he is aware   Rastus Borton

## 2021-01-23 NOTE — Telephone Encounter (Signed)
Pt called in to confirm the appts for 01/27/21 but req that we cancel the iron tx as she req to consult with [place] first due to her reacting last time and due to the cost  Titus Drone

## 2021-01-24 ENCOUNTER — Ambulatory Visit: Payer: 59

## 2021-01-24 ENCOUNTER — Other Ambulatory Visit: Payer: 59

## 2021-01-24 ENCOUNTER — Ambulatory Visit: Payer: 59 | Admitting: Family

## 2021-01-27 ENCOUNTER — Encounter: Payer: Self-pay | Admitting: Family

## 2021-01-27 ENCOUNTER — Inpatient Hospital Stay: Payer: 59

## 2021-01-27 ENCOUNTER — Inpatient Hospital Stay (HOSPITAL_BASED_OUTPATIENT_CLINIC_OR_DEPARTMENT_OTHER): Payer: 59 | Admitting: Family

## 2021-01-27 ENCOUNTER — Telehealth: Payer: Self-pay

## 2021-01-27 ENCOUNTER — Other Ambulatory Visit: Payer: Self-pay

## 2021-01-27 ENCOUNTER — Inpatient Hospital Stay: Payer: 59 | Attending: Family

## 2021-01-27 VITALS — BP 104/70 | HR 65 | Temp 98.3°F | Resp 18 | Ht 62.0 in | Wt 156.1 lb

## 2021-01-27 DIAGNOSIS — D500 Iron deficiency anemia secondary to blood loss (chronic): Secondary | ICD-10-CM | POA: Diagnosis not present

## 2021-01-27 DIAGNOSIS — N920 Excessive and frequent menstruation with regular cycle: Secondary | ICD-10-CM | POA: Insufficient documentation

## 2021-01-27 LAB — CBC WITH DIFFERENTIAL (CANCER CENTER ONLY)
Abs Immature Granulocytes: 0 10*3/uL (ref 0.00–0.07)
Basophils Absolute: 0 10*3/uL (ref 0.0–0.1)
Basophils Relative: 1 %
Eosinophils Absolute: 0.1 10*3/uL (ref 0.0–0.5)
Eosinophils Relative: 3 %
HCT: 26.6 % — ABNORMAL LOW (ref 36.0–46.0)
Hemoglobin: 6.8 g/dL — CL (ref 12.0–15.0)
Immature Granulocytes: 0 %
Lymphocytes Relative: 52 %
Lymphs Abs: 1.4 10*3/uL (ref 0.7–4.0)
MCH: 15.6 pg — ABNORMAL LOW (ref 26.0–34.0)
MCHC: 25.6 g/dL — ABNORMAL LOW (ref 30.0–36.0)
MCV: 60.9 fL — ABNORMAL LOW (ref 80.0–100.0)
Monocytes Absolute: 0.4 10*3/uL (ref 0.1–1.0)
Monocytes Relative: 13 %
Neutro Abs: 0.8 10*3/uL — ABNORMAL LOW (ref 1.7–7.7)
Neutrophils Relative %: 31 %
Platelet Count: 218 10*3/uL (ref 150–400)
RBC: 4.37 MIL/uL (ref 3.87–5.11)
RDW: 19.9 % — ABNORMAL HIGH (ref 11.5–15.5)
WBC Count: 2.7 10*3/uL — ABNORMAL LOW (ref 4.0–10.5)
nRBC: 0 % (ref 0.0–0.2)

## 2021-01-27 LAB — RETICULOCYTES
Immature Retic Fract: 24.7 % — ABNORMAL HIGH (ref 2.3–15.9)
RBC.: 4.46 MIL/uL (ref 3.87–5.11)
Retic Count, Absolute: 69.1 10*3/uL (ref 19.0–186.0)
Retic Ct Pct: 1.6 % (ref 0.4–3.1)

## 2021-01-27 LAB — IRON AND TIBC
Iron: 12 ug/dL — ABNORMAL LOW (ref 41–142)
Saturation Ratios: 3 % — ABNORMAL LOW (ref 21–57)
TIBC: 420 ug/dL (ref 236–444)
UIBC: 408 ug/dL — ABNORMAL HIGH (ref 120–384)

## 2021-01-27 LAB — FERRITIN: Ferritin: <4 ng/mL — ABNORMAL LOW (ref 11–307)

## 2021-01-27 NOTE — Progress Notes (Signed)
Hematology and Oncology Follow Up Visit  Wanda Coffey [id number] [birth year] 35 y.o. 01/27/2021   Principle Diagnosis:  Iron deficiency anemia secondary to heavy cycles - fibroids  Current Therapy:   IV iron as indicated - has had hives with Feraheme in past, premedicate   Interim History:  Wanda Coffey is here today for follow-up. Hgb is 6.8, MCV 60, platelets 218 and WBC count 2.7.  She is symptomatic with fatigue, SOB with exertion and craving ice.  She stays quite busy with her 3 sweet babies.  She states that her cycle is still heavy but regular. She has met with her gynecologist and will be having a trans vaginal [place] to assess her fibroids. This will help them determine a treatment plan moving forward.  No other blood loss noted. No bruising or petechiae.  No fever, chills, n/v, cough, rash, dizziness, chest pain, palpitations, abdominal pain or changes in bowel or bladder habits. No swelling, tenderness, numbness or tingling in her extremities.  No falls or syncope.  She has maintained a good appetite and is staying well hydrated. Her weight is stable at 156 lbs.   ECOG Performance Status: 1 - Symptomatic but completely ambulatory  Medications:  Allergies as of 01/27/2021   No Known Allergies     Medication List       Accurate as of Jan 27, 2021  9:14 AM. If you have any questions, ask your nurse or doctor.        ferrous sulfate 325 (65 FE) MG tablet Take 325 mg by mouth 2 (two) times daily.   prenatal multivitamin Tabs tablet Take 1 tablet by mouth daily at 12 noon.   PROBIOTIC PO Take 1 capsule by mouth daily.   Vitamin D (Ergocalciferol) 1.25 MG (50000 UNIT) Caps capsule Commonly known as: DRISDOL Take 50,000 Units by mouth once a week.       Allergies: No Known Allergies  Past Medical History, Surgical history, Social history, and Family History were reviewed and updated.  Review of Systems: All other 10 point review of systems is negative.    Physical Exam:  vitals were not taken for this visit.   Wt Readings from Last 3 Encounters:  04/11/20 172 lb 6.4 oz (78.2 kg)  10/06/18 208 lb (94.3 kg)  10/08/18 198 lb 6.6 oz (90 kg)    Ocular: Sclerae unicteric, pupils equal, round and reactive to light Ear-nose-throat: Oropharynx clear, dentition fair Lymphatic: No cervical or supraclavicular adenopathy Lungs no rales or rhonchi, good excursion bilaterally Heart regular rate and rhythm, no murmur appreciated Abd soft, nontender, positive bowel sounds MSK no focal spinal tenderness, no joint edema Neuro: non-focal, well-oriented, appropriate affect Breasts: Deferred   Lab Results  Component Value Date   WBC 2.7 (L) 01/27/2021   HGB 6.8 (LL) 01/27/2021   HCT 26.6 (L) 01/27/2021   MCV 60.9 (L) 01/27/2021   PLT 218 01/27/2021   Lab Results  Component Value Date   FERRITIN 214 04/11/2020   IRON 66 04/11/2020   TIBC 371 04/11/2020   UIBC 306 04/11/2020   IRONPCTSAT 18 (L) 04/11/2020   Lab Results  Component Value Date   RETICCTPCT 1.6 01/27/2021   RBC 4.46 01/27/2021   No results found for: KPAFRELGTCHN, LAMBDASER, KAPLAMBRATIO No results found for: IGGSERUM, IGA, IGMSERUM No results found for: Ronnald Ramp, A1GS, A2GS, BETS, BETA2SER, GAMS, MSPIKE, SPEI   Chemistry      Component Value Date/Time   NA 139 04/11/2020 0848   K 3.9 04/11/2020  0848   CL 107 04/11/2020 0848   CO2 27 04/11/2020 0848   BUN 8 04/11/2020 0848   CREATININE 0.75 04/11/2020 0848      Component Value Date/Time   CALCIUM 9.5 04/11/2020 0848   ALKPHOS 58 04/11/2020 0848   AST 13 (L) 04/11/2020 0848   ALT 7 04/11/2020 0848   BILITOT 0.9 04/11/2020 0848       Impression and Plan: Wanda Coffey is a very pleasant 35 yo African American female with history of iron deficiency anemia secondary to heavy cycles.  She would like to try taking an oral supplement first before getting IV iron again. She reacted with Feraheme with  hives and the cost is also a stressor for her.  She will take fusion plus daily and we will reassess in 1 month.  She was encouraged top contact our office with any questions or concerns and to go the the ED in the event of an emergency.   Laverna Peace, NP 5/23/20229:14 AM

## 2021-01-27 NOTE — Telephone Encounter (Signed)
Per 01/27/21 los, pt aware of f/u appt and placed in her phone and did req not to print  Wanda Coffey

## 2021-02-06 ENCOUNTER — Other Ambulatory Visit: Payer: Self-pay | Admitting: Physician Assistant

## 2021-02-06 DIAGNOSIS — R1011 Right upper quadrant pain: Secondary | ICD-10-CM

## 2021-02-10 ENCOUNTER — Encounter (HOSPITAL_BASED_OUTPATIENT_CLINIC_OR_DEPARTMENT_OTHER): Payer: Self-pay

## 2021-02-10 ENCOUNTER — Other Ambulatory Visit: Payer: Self-pay

## 2021-02-10 DIAGNOSIS — K649 Unspecified hemorrhoids: Secondary | ICD-10-CM | POA: Insufficient documentation

## 2021-02-10 NOTE — ED Triage Notes (Signed)
Pt c/o hemorrhoid pain x 3 days with "some bleeding"-NAD-steady gait

## 2021-02-11 ENCOUNTER — Emergency Department (HOSPITAL_BASED_OUTPATIENT_CLINIC_OR_DEPARTMENT_OTHER)
Admission: EM | Admit: 2021-02-11 | Discharge: 2021-02-11 | Disposition: A | Discharge status: Home / Self Care | Payer: 59 | Attending: Emergency Medicine | Admitting: Emergency Medicine

## 2021-02-11 DIAGNOSIS — K649 Unspecified hemorrhoids: Secondary | ICD-10-CM

## 2021-02-11 MED ORDER — OXYCODONE-ACETAMINOPHEN 5-325 MG PO TABS
2.0000 | ORAL_TABLET | Freq: Once | ORAL | Status: AC
  Administered 2021-02-11: 2 via ORAL
  Filled 2021-02-11: qty 2

## 2021-02-11 MED ORDER — KETOROLAC TROMETHAMINE 60 MG/2ML IM SOLN
60.0000 mg | Freq: Once | INTRAMUSCULAR | Status: AC
  Administered 2021-02-11: 60 mg via INTRAMUSCULAR
  Filled 2021-02-11: qty 2

## 2021-02-11 MED ORDER — OXYCODONE-ACETAMINOPHEN 5-325 MG PO TABS: 1.0000 | ORAL_TABLET | Freq: Four times a day (QID) | ORAL | 0 refills | Status: AC | PRN

## 2021-02-11 NOTE — ED Provider Notes (Signed)
[facility] EMERGENCY DEPARTMENT Provider Note   CSN: [id number] Arrival date & time: 02/10/21  2123     History Chief Complaint  Patient presents with  . Hemorrhoids    Wanda Coffey is a 35 y.o. female.  Hemorrhoids intermittently for awhile. Now again. No help with sitz baths, preparation h, rectacreme and multiple other things. Is on stool softeners. No constipation recently. No emesis.         Past Medical History:  Diagnosis Date  . Anemia   . Fibroid     Patient Active Problem List   Diagnosis Date Noted  . S/P primary low transverse C-section 10/08/2018  . Indication for care in labor or delivery 06/12/2017  . SVD (spontaneous vaginal delivery) 06/12/2017    Past Surgical History:  Procedure Laterality Date  . CESAREAN SECTION MULTI-GESTATIONAL N/A 10/08/2018   Procedure: CESAREAN SECTION MULTI-GESTATIONAL;  Surgeon: Sherlyn Hay, DO;  Location: [place];  Service: Obstetrics;  Laterality: N/A;  Tracey RNFA  . NO PAST SURGERIES    . WISDOM TOOTH EXTRACTION       OB History    Gravida  2   Para  1   Term  1   Preterm      AB      Living  1     SAB      IAB      Ectopic      Multiple  0   Live Births  1           Family History  Problem Relation Age of Onset  . Cancer Father   . Heart disease Father   . Thyroid disease Sister     Social History   Tobacco Use  . Smoking status: Never Smoker  . Smokeless tobacco: Never Used  Vaping Use  . Vaping Use: Never used  Substance Use Topics  . Alcohol use: No  . Drug use: No    Home Medications Prior to Admission medications   Medication Sig Start Date End Date Taking? Authorizing Provider  oxyCODONE-acetaminophen (PERCOCET) 5-325 MG tablet Take 1 tablet by mouth every 6 (six) hours as needed for severe pain. 02/11/21  Yes Keeton Kassebaum, Corene Cornea, MD  ferrous sulfate 325 (65 FE) MG tablet Take 325 mg by mouth 2 (two) times daily. 03/15/17   [provider]  Probiotic Product (PROBIOTIC PO) Take 1 capsule by mouth daily.     [provider]  Vitamin D, Ergocalciferol, (DRISDOL) 1.25 MG (50000 UNIT) CAPS capsule Take 50,000 Units by mouth once a week. 04/03/20   [provider]    Allergies    Ferumoxytol  Review of Systems   Review of Systems  All other systems reviewed and are negative.   Physical Exam Updated Vital Signs BP (!) 100/48 (BP Location: Left Arm)   Pulse 68   Temp 98.4 F (36.9 C) (Oral)   Resp 16   Ht 5\' 2"  (1.575 m)   Wt 69.9 kg   LMP 01/17/2021   SpO2 100%   BMI 28.17 kg/m   Physical Exam Vitals and nursing note reviewed.  Constitutional:      Appearance: She is well-developed.  HENT:     Head: Normocephalic and atraumatic.     Mouth/Throat:     Mouth: Mucous membranes are moist.     Pharynx: Oropharynx is clear.  Eyes:     Pupils: Pupils are equal, round, and reactive to light.  Cardiovascular:  Rate and Rhythm: Normal rate and regular rhythm.  Pulmonary:     Effort: No respiratory distress.     Breath sounds: No stridor.  Abdominal:     General: Abdomen is flat. There is no distension.  Genitourinary:    Comments: Multiple nonthrombosed hemorrhoids.   Chaperoned by nurse melanie.  Musculoskeletal:        General: No swelling or tenderness. Normal range of motion.     Cervical back: Normal range of motion.  Skin:    General: Skin is warm and dry.  Neurological:     General: No focal deficit present.     Mental Status: She is alert.     ED Results / Procedures / Treatments   Labs (all labs ordered are listed, but only abnormal results are displayed) Labs Reviewed - No data to display  EKG None  Radiology No results found.  Procedures Procedures   Medications Ordered in ED Medications  oxyCODONE-acetaminophen (PERCOCET/ROXICET) 5-325 MG per tablet 2 tablet (2 tablets Oral Given 02/11/21 0047)  ketorolac (TORADOL) injection 60 mg (60 mg Intramuscular Given  02/11/21 0051)    ED Course  I have reviewed the triage vital signs and the nursing notes.  Pertinent labs & imaging results that were available during my care of the patient were reviewed by me and considered in my medical decision making (see chart for details).    MDM Rules/Calculators/A&P                          Non thrombosed, non infected hemorrhoids. supporitve care with surgical follow up.   Final Clinical Impression(s) / ED Diagnoses Final diagnoses:  Hemorrhoids, unspecified hemorrhoid type    Rx / DC Orders ED Discharge Orders         Ordered    oxyCODONE-acetaminophen (PERCOCET) 5-325 MG tablet  Every 6 hours PRN        02/11/21 0127           Denessa Cavan, Corene Cornea, MD 02/11/21 [phone]

## 2021-02-12 ENCOUNTER — Encounter (HOSPITAL_COMMUNITY): Payer: Self-pay

## 2021-02-12 ENCOUNTER — Emergency Department (HOSPITAL_COMMUNITY)
Admission: EM | Admit: 2021-02-12 | Discharge: 2021-02-13 | Disposition: A | Discharge status: Home / Self Care | Payer: 59 | Attending: Emergency Medicine | Admitting: Emergency Medicine

## 2021-02-12 DIAGNOSIS — K644 Residual hemorrhoidal skin tags: Secondary | ICD-10-CM | POA: Insufficient documentation

## 2021-02-12 NOTE — ED Provider Notes (Signed)
Emergency Medicine Provider Triage Evaluation Note  Annali Lybrand , a 35 y.o. female  was evaluated in triage.  Pt complains of external hemorrhoids. Seen at [facility] for same, she is also constipated.  Review of Systems  Positive: Hemorrhoids, constipation Negative: diarrhea  Physical Exam  BP 112/69   Pulse 90   Temp 98.4 F (36.9 C) (Oral)   Resp 18   LMP 01/17/2021   SpO2 100%  Gen:   Awake, no distress   Resp:  Normal effort  MSK:   Moves extremities without difficulty   Medical Decision Making  Medically screening exam initiated at 9:32 PM.  Appropriate orders placed.  Jaylanni Eltringham was informed that the remainder of the evaluation will be completed by another provider, this initial triage assessment does not replace that evaluation, and the importance of remaining in the ED until their evaluation is complete.     Bishop Dublin 02/12/21 2133    Sherwood Gambler, MD 02/12/21 [phone]

## 2021-02-12 NOTE — ED Triage Notes (Signed)
Pt reports that she has some external hemorrhoids that have been bothering her, seen for the same and told to follow up with surgery.

## 2021-02-13 MED ORDER — HYDROCORTISONE (PERIANAL) 2.5 % EX CREA: 1.0000 "application " | TOPICAL_CREAM | Freq: Two times a day (BID) | CUTANEOUS | 0 refills | Status: AC

## 2021-02-13 MED ORDER — BENZOCAINE (TOPICAL) 20 % EX OINT: TOPICAL_OINTMENT | CUTANEOUS | 0 refills | Status: AC

## 2021-02-13 NOTE — ED Provider Notes (Signed)
[facility] EMERGENCY DEPARTMENT Provider Note   CSN: [id number] Arrival date & time: 02/12/21  2115     History Chief Complaint  Patient presents with   Hemorrhoids    Wanda Coffey is a 35 y.o. female.  Patient to ED for evaluation of persistent external hemorrhoids. She reports bleeding that is slow. She reports her last bowel movement was 4 days ago with history of constipation. She was seen on 6/5 for same and was referred to general surgery and has made an appointment for the first available time but this isn't until July. She is using multiple over the counter preparations, alternating Sitz baths with ice compresses, using stool softeners and drinking lots of water, all without relief.   The history is provided by the patient and the spouse. No language interpreter was used.      Past Medical History:  Diagnosis Date   Anemia    Fibroid     Patient Active Problem List   Diagnosis Date Noted   S/P primary low transverse C-section 10/08/2018   Indication for care in labor or delivery 06/12/2017   SVD (spontaneous vaginal delivery) 06/12/2017    Past Surgical History:  Procedure Laterality Date   CESAREAN SECTION MULTI-GESTATIONAL N/A 10/08/2018   Procedure: CESAREAN SECTION MULTI-GESTATIONAL;  Surgeon: Sherlyn Hay, DO;  Location: [place];  Service: Obstetrics;  Laterality: N/A;  Tracey RNFA   NO PAST SURGERIES     WISDOM TOOTH EXTRACTION       OB History     Gravida  2   Para  1   Term  1   Preterm      AB      Living  1      SAB      IAB      Ectopic      Multiple  0   Live Births  1           Family History  Problem Relation Age of Onset   Cancer Father    Heart disease Father    Thyroid disease Sister     Social History   Tobacco Use   Smoking status: Never   Smokeless tobacco: Never  Vaping Use   Vaping Use: Never used  Substance Use Topics   Alcohol use: No   Drug use: No    Home  Medications Prior to Admission medications   Medication Sig Start Date End Date Taking? Authorizing Provider  ferrous sulfate 325 (65 FE) MG tablet Take 325 mg by mouth 2 (two) times daily. 03/15/17   [provider]  oxyCODONE-acetaminophen (PERCOCET) 5-325 MG tablet Take 1 tablet by mouth every 6 (six) hours as needed for severe pain. 02/11/21   Mesner, Corene Cornea, MD  Probiotic Product (PROBIOTIC PO) Take 1 capsule by mouth daily.     [provider]  Vitamin D, Ergocalciferol, (DRISDOL) 1.25 MG (50000 UNIT) CAPS capsule Take 50,000 Units by mouth once a week. 04/03/20   [provider]    Allergies    Ferumoxytol  Review of Systems   Review of Systems  Gastrointestinal:  Positive for anal bleeding, constipation and rectal pain. Negative for vomiting.  Neurological:  Negative for light-headedness.   Physical Exam Updated Vital Signs BP 112/69   Pulse 90   Temp 98.4 F (36.9 C) (Oral)   Resp 18   LMP 01/17/2021   SpO2 100%   Physical Exam Vitals and nursing note reviewed.  Constitutional:  Appearance: Normal appearance. She is not ill-appearing.  Cardiovascular:     Rate and Rhythm: Normal rate.  Pulmonary:     Effort: Pulmonary effort is normal.  Abdominal:     Palpations: Abdomen is soft.  Genitourinary:    Comments: Large non-thrombosed external hemorrhoid without active bleed.  Skin:    General: Skin is warm and dry.  Neurological:     Mental Status: She is alert and oriented to person, place, and time.    ED Results / Procedures / Treatments   Labs (all labs ordered are listed, but only abnormal results are displayed) Labs Reviewed - No data to display  EKG None  Radiology No results found.  Procedures Procedures   Medications Ordered in ED Medications - No data to display  ED Course  I have reviewed the triage vital signs and the nursing notes.  Pertinent labs & imaging results that were available during my care of the  patient were reviewed by me and considered in my medical decision making (see chart for details).    MDM Rules/Calculators/A&P                          Patient to ED for second time in one week for evaluation and treatment of painful external hemorrhoids.   No change in exam from first visit, reviewed in the chart, to tonight's visit. No evidence of thrombosis.   Discussed treatment options. Will provide benzocaine topical, Anusol AC. Discussed treatment for constipation.   Final Clinical Impression(s) / ED Diagnoses Final diagnoses:  None   External hemorrhoids.  Rx / DC Orders ED Discharge Orders     None        Charlann Lange, PA-C 02/13/21 0762    LongWonda Olds, MD 02/13/21 [phone]

## 2021-02-13 NOTE — Discharge Instructions (Addendum)
Use the topical prescription agents for hemorrhoid relief. Continue Sitz baths, ice compresses, stool softener as before.   Recommend Miralax to relieve constipation. Use one dose 3 times daily until bowels clear for a maximum of 3 consecutive days.   Keep your scheduled appointment with surgery in July.

## 2021-02-18 ENCOUNTER — Ambulatory Visit
Admission: RE | Admit: 2021-02-18 | Discharge: 2021-02-18 | Disposition: A | Discharge status: Home / Self Care | Payer: 59 | Source: Ambulatory Visit | Attending: Physician Assistant | Admitting: Physician Assistant

## 2021-02-18 DIAGNOSIS — R1011 Right upper quadrant pain: Secondary | ICD-10-CM

## 2021-02-25 ENCOUNTER — Other Ambulatory Visit: Payer: Self-pay | Admitting: Family

## 2021-02-28 ENCOUNTER — Other Ambulatory Visit: Payer: Self-pay | Admitting: Physician Assistant

## 2021-02-28 DIAGNOSIS — K7689 Other specified diseases of liver: Secondary | ICD-10-CM

## 2021-03-04 ENCOUNTER — Inpatient Hospital Stay: Payer: 59 | Admitting: Family

## 2021-03-04 ENCOUNTER — Inpatient Hospital Stay: Payer: 59

## 2021-03-18 ENCOUNTER — Other Ambulatory Visit: Payer: Self-pay

## 2021-03-18 ENCOUNTER — Ambulatory Visit
Admission: RE | Admit: 2021-03-18 | Discharge: 2021-03-18 | Disposition: A | Discharge status: Home / Self Care | Payer: 59 | Source: Ambulatory Visit | Attending: Physician Assistant | Admitting: Physician Assistant

## 2021-03-18 DIAGNOSIS — K7689 Other specified diseases of liver: Secondary | ICD-10-CM

## 2021-03-18 MED ORDER — GADOBENATE DIMEGLUMINE 529 MG/ML IV SOLN
14.0000 mL | Freq: Once | INTRAVENOUS | Status: AC | PRN
  Administered 2021-03-18: 14 mL via INTRAVENOUS
